# Patient Record
Sex: Female | Born: 1935 | Race: White | Hispanic: No
Health system: Southern US, Community
[De-identification: ages and names within clinical notes are randomized; demographics above are authoritative.]

## PROBLEM LIST (undated history)

## (undated) DIAGNOSIS — I639 Cerebral infarction, unspecified: Secondary | ICD-10-CM

## (undated) DIAGNOSIS — E7800 Pure hypercholesterolemia, unspecified: Secondary | ICD-10-CM

## (undated) DIAGNOSIS — I4891 Unspecified atrial fibrillation: Secondary | ICD-10-CM

## (undated) DIAGNOSIS — I2510 Atherosclerotic heart disease of native coronary artery without angina pectoris: Secondary | ICD-10-CM

## (undated) DIAGNOSIS — E079 Disorder of thyroid, unspecified: Secondary | ICD-10-CM

## (undated) DIAGNOSIS — K922 Gastrointestinal hemorrhage, unspecified: Secondary | ICD-10-CM

## (undated) DIAGNOSIS — I219 Acute myocardial infarction, unspecified: Secondary | ICD-10-CM

## (undated) DIAGNOSIS — I509 Heart failure, unspecified: Secondary | ICD-10-CM

## (undated) DIAGNOSIS — I10 Essential (primary) hypertension: Secondary | ICD-10-CM

## (undated) HISTORY — PX: ABDOMINAL HYSTERECTOMY: SHX81

## (undated) HISTORY — PX: CHOLECYSTECTOMY: SHX55

## (undated) HISTORY — PX: TONSILLECTOMY: SUR1361

---

## 2016-04-07 ENCOUNTER — Inpatient Hospital Stay (HOSPITAL_COMMUNITY)
Admission: EM | Admit: 2016-04-07 | Discharge: 2016-04-30 | DRG: 023 | Disposition: E | Payer: Medicare Other | Attending: Neurology | Admitting: Neurology

## 2016-04-07 ENCOUNTER — Encounter (HOSPITAL_COMMUNITY): Payer: Self-pay | Admitting: Emergency Medicine

## 2016-04-07 ENCOUNTER — Inpatient Hospital Stay (HOSPITAL_COMMUNITY): Payer: Medicare Other

## 2016-04-07 ENCOUNTER — Emergency Department (HOSPITAL_COMMUNITY): Payer: Medicare Other

## 2016-04-07 ENCOUNTER — Encounter (HOSPITAL_COMMUNITY): Admission: EM | Disposition: E | Payer: Self-pay | Source: Home / Self Care | Attending: Neurology

## 2016-04-07 ENCOUNTER — Inpatient Hospital Stay (HOSPITAL_COMMUNITY): Payer: Medicare Other | Admitting: Certified Registered Nurse Anesthetist

## 2016-04-07 DIAGNOSIS — R4701 Aphasia: Secondary | ICD-10-CM | POA: Diagnosis present

## 2016-04-07 DIAGNOSIS — I63032 Cerebral infarction due to thrombosis of left carotid artery: Secondary | ICD-10-CM | POA: Diagnosis present

## 2016-04-07 DIAGNOSIS — I97711 Intraoperative cardiac arrest during other surgery: Secondary | ICD-10-CM | POA: Diagnosis not present

## 2016-04-07 DIAGNOSIS — R001 Bradycardia, unspecified: Secondary | ICD-10-CM | POA: Diagnosis not present

## 2016-04-07 DIAGNOSIS — E876 Hypokalemia: Secondary | ICD-10-CM | POA: Diagnosis present

## 2016-04-07 DIAGNOSIS — G935 Compression of brain: Secondary | ICD-10-CM | POA: Diagnosis not present

## 2016-04-07 DIAGNOSIS — G9340 Encephalopathy, unspecified: Secondary | ICD-10-CM | POA: Diagnosis present

## 2016-04-07 DIAGNOSIS — D72829 Elevated white blood cell count, unspecified: Secondary | ICD-10-CM | POA: Diagnosis present

## 2016-04-07 DIAGNOSIS — Z9282 Status post administration of tPA (rtPA) in a different facility within the last 24 hours prior to admission to current facility: Secondary | ICD-10-CM | POA: Diagnosis not present

## 2016-04-07 DIAGNOSIS — I480 Paroxysmal atrial fibrillation: Secondary | ICD-10-CM | POA: Diagnosis present

## 2016-04-07 DIAGNOSIS — G936 Cerebral edema: Secondary | ICD-10-CM | POA: Diagnosis not present

## 2016-04-07 DIAGNOSIS — I509 Heart failure, unspecified: Secondary | ICD-10-CM | POA: Diagnosis present

## 2016-04-07 DIAGNOSIS — E039 Hypothyroidism, unspecified: Secondary | ICD-10-CM | POA: Diagnosis present

## 2016-04-07 DIAGNOSIS — I63512 Cerebral infarction due to unspecified occlusion or stenosis of left middle cerebral artery: Secondary | ICD-10-CM | POA: Diagnosis not present

## 2016-04-07 DIAGNOSIS — E119 Type 2 diabetes mellitus without complications: Secondary | ICD-10-CM | POA: Diagnosis present

## 2016-04-07 DIAGNOSIS — I110 Hypertensive heart disease with heart failure: Secondary | ICD-10-CM | POA: Diagnosis present

## 2016-04-07 DIAGNOSIS — I2510 Atherosclerotic heart disease of native coronary artery without angina pectoris: Secondary | ICD-10-CM | POA: Diagnosis present

## 2016-04-07 DIAGNOSIS — Z90710 Acquired absence of both cervix and uterus: Secondary | ICD-10-CM

## 2016-04-07 DIAGNOSIS — W19XXXA Unspecified fall, initial encounter: Secondary | ICD-10-CM | POA: Diagnosis present

## 2016-04-07 DIAGNOSIS — Z66 Do not resuscitate: Secondary | ICD-10-CM | POA: Diagnosis not present

## 2016-04-07 DIAGNOSIS — Z8673 Personal history of transient ischemic attack (TIA), and cerebral infarction without residual deficits: Secondary | ICD-10-CM | POA: Diagnosis not present

## 2016-04-07 DIAGNOSIS — E785 Hyperlipidemia, unspecified: Secondary | ICD-10-CM | POA: Diagnosis present

## 2016-04-07 DIAGNOSIS — I998 Other disorder of circulatory system: Secondary | ICD-10-CM

## 2016-04-07 DIAGNOSIS — I63312 Cerebral infarction due to thrombosis of left middle cerebral artery: Secondary | ICD-10-CM | POA: Diagnosis not present

## 2016-04-07 DIAGNOSIS — I639 Cerebral infarction, unspecified: Secondary | ICD-10-CM

## 2016-04-07 DIAGNOSIS — J9690 Respiratory failure, unspecified, unspecified whether with hypoxia or hypercapnia: Secondary | ICD-10-CM

## 2016-04-07 DIAGNOSIS — D649 Anemia, unspecified: Secondary | ICD-10-CM | POA: Diagnosis present

## 2016-04-07 DIAGNOSIS — J9601 Acute respiratory failure with hypoxia: Secondary | ICD-10-CM | POA: Diagnosis not present

## 2016-04-07 DIAGNOSIS — Z515 Encounter for palliative care: Secondary | ICD-10-CM | POA: Diagnosis not present

## 2016-04-07 DIAGNOSIS — R29724 NIHSS score 24: Secondary | ICD-10-CM | POA: Diagnosis present

## 2016-04-07 DIAGNOSIS — I6789 Other cerebrovascular disease: Secondary | ICD-10-CM | POA: Diagnosis not present

## 2016-04-07 DIAGNOSIS — G8191 Hemiplegia, unspecified affecting right dominant side: Secondary | ICD-10-CM | POA: Diagnosis present

## 2016-04-07 DIAGNOSIS — E038 Other specified hypothyroidism: Secondary | ICD-10-CM

## 2016-04-07 DIAGNOSIS — I252 Old myocardial infarction: Secondary | ICD-10-CM

## 2016-04-07 DIAGNOSIS — Z4659 Encounter for fitting and adjustment of other gastrointestinal appliance and device: Secondary | ICD-10-CM

## 2016-04-07 DIAGNOSIS — I63413 Cerebral infarction due to embolism of bilateral middle cerebral arteries: Secondary | ICD-10-CM | POA: Diagnosis present

## 2016-04-07 DIAGNOSIS — R32 Unspecified urinary incontinence: Secondary | ICD-10-CM | POA: Diagnosis present

## 2016-04-07 DIAGNOSIS — J9600 Acute respiratory failure, unspecified whether with hypoxia or hypercapnia: Secondary | ICD-10-CM | POA: Diagnosis not present

## 2016-04-07 DIAGNOSIS — Q251 Coarctation of aorta: Secondary | ICD-10-CM

## 2016-04-07 HISTORY — DX: Unspecified atrial fibrillation: I48.91

## 2016-04-07 HISTORY — DX: Pure hypercholesterolemia, unspecified: E78.00

## 2016-04-07 HISTORY — DX: Heart failure, unspecified: I50.9

## 2016-04-07 HISTORY — DX: Disorder of thyroid, unspecified: E07.9

## 2016-04-07 HISTORY — PX: IR GENERIC HISTORICAL: IMG1180011

## 2016-04-07 HISTORY — DX: Cerebral infarction, unspecified: I63.9

## 2016-04-07 HISTORY — DX: Essential (primary) hypertension: I10

## 2016-04-07 HISTORY — PX: RADIOLOGY WITH ANESTHESIA: SHX6223

## 2016-04-07 HISTORY — DX: Atherosclerotic heart disease of native coronary artery without angina pectoris: I25.10

## 2016-04-07 HISTORY — DX: Acute myocardial infarction, unspecified: I21.9

## 2016-04-07 HISTORY — DX: Gastrointestinal hemorrhage, unspecified: K92.2

## 2016-04-07 LAB — POCT I-STAT 3, ART BLOOD GAS (G3+)
ACID-BASE DEFICIT: 3 mmol/L — AB (ref 0.0–2.0)
Bicarbonate: 21.6 mEq/L (ref 20.0–24.0)
O2 Saturation: 98 %
PCO2 ART: 35.1 mmHg (ref 35.0–45.0)
PH ART: 7.398 (ref 7.350–7.450)
PO2 ART: 107 mmHg — AB (ref 80.0–100.0)
Patient temperature: 98.6
TCO2: 23 mmol/L (ref 0–100)

## 2016-04-07 SURGERY — RADIOLOGY WITH ANESTHESIA
Anesthesia: General

## 2016-04-07 MED ORDER — MIDAZOLAM HCL 5 MG/5ML IJ SOLN
INTRAMUSCULAR | Status: DC | PRN
  Administered 2016-04-07: 2 mg via INTRAVENOUS

## 2016-04-07 MED ORDER — ROCURONIUM BROMIDE 100 MG/10ML IV SOLN
INTRAVENOUS | Status: DC | PRN
  Administered 2016-04-07: 20 mg via INTRAVENOUS
  Administered 2016-04-07 (×2): 30 mg via INTRAVENOUS
  Administered 2016-04-07: 20 mg via INTRAVENOUS

## 2016-04-07 MED ORDER — EPHEDRINE SULFATE 50 MG/ML IJ SOLN
INTRAMUSCULAR | Status: DC | PRN
  Administered 2016-04-07: 5 mg via INTRAVENOUS
  Administered 2016-04-07: 7.5 mg via INTRAVENOUS

## 2016-04-07 MED ORDER — NITROGLYCERIN 1 MG/10 ML FOR IR/CATH LAB
INTRA_ARTERIAL | Status: AC
  Filled 2016-04-07: qty 10

## 2016-04-07 MED ORDER — SODIUM CHLORIDE 0.9 % IV SOLN
INTRAVENOUS | Status: DC
  Administered 2016-04-07: 1000 mL via INTRAVENOUS

## 2016-04-07 MED ORDER — ACETAMINOPHEN 650 MG RE SUPP: 650.0000 mg | Freq: Four times a day (QID) | RECTAL | Status: DC | PRN

## 2016-04-07 MED ORDER — ACETAMINOPHEN 650 MG RE SUPP: 650.0000 mg | RECTAL | Status: DC | PRN

## 2016-04-07 MED ORDER — IOPAMIDOL (ISOVUE-370) INJECTION 76%: 50.0000 mL | Freq: Once | INTRAVENOUS | Status: DC | PRN

## 2016-04-07 MED ORDER — ACETAMINOPHEN 325 MG PO TABS: 650.0000 mg | ORAL_TABLET | ORAL | Status: DC | PRN

## 2016-04-07 MED ORDER — LACTATED RINGERS IV SOLN
INTRAVENOUS | Status: DC | PRN
  Administered 2016-04-07: 20:00:00 via INTRAVENOUS

## 2016-04-07 MED ORDER — IOPAMIDOL (ISOVUE-300) INJECTION 61%
INTRAVENOUS | Status: AC
  Administered 2016-04-07: 80 mL
  Filled 2016-04-07: qty 300

## 2016-04-07 MED ORDER — CEFAZOLIN SODIUM-DEXTROSE 2-4 GM/100ML-% IV SOLN
INTRAVENOUS | Status: AC
  Filled 2016-04-07: qty 100

## 2016-04-07 MED ORDER — SODIUM CHLORIDE 0.9 % IV SOLN
INTRAVENOUS | Status: DC
  Administered 2016-04-08 – 2016-04-09 (×2): via INTRAVENOUS

## 2016-04-07 MED ORDER — EPTIFIBATIDE 20 MG/10ML IV SOLN
INTRAVENOUS | Status: AC
  Filled 2016-04-07: qty 10

## 2016-04-07 MED ORDER — SENNOSIDES-DOCUSATE SODIUM 8.6-50 MG PO TABS
1.0000 | ORAL_TABLET | Freq: Every evening | ORAL | Status: DC | PRN
  Filled 2016-04-07: qty 1

## 2016-04-07 MED ORDER — ACETAMINOPHEN 500 MG PO TABS
1000.0000 mg | ORAL_TABLET | Freq: Four times a day (QID) | ORAL | Status: DC | PRN
  Administered 2016-04-09: 1000 mg via ORAL
  Filled 2016-04-07: qty 2

## 2016-04-07 MED ORDER — ESMOLOL HCL 100 MG/10ML IV SOLN
INTRAVENOUS | Status: DC | PRN
  Administered 2016-04-07: 30 mg via INTRAVENOUS

## 2016-04-07 MED ORDER — SUCCINYLCHOLINE CHLORIDE 20 MG/ML IJ SOLN
INTRAMUSCULAR | Status: DC | PRN
  Administered 2016-04-07: 120 mg via INTRAVENOUS

## 2016-04-07 MED ORDER — CEFAZOLIN SODIUM-DEXTROSE 2-3 GM-% IV SOLR
INTRAVENOUS | Status: DC | PRN
  Administered 2016-04-07: 2 g via INTRAVENOUS

## 2016-04-07 MED ORDER — FENTANYL CITRATE (PF) 100 MCG/2ML IJ SOLN
INTRAMUSCULAR | Status: DC | PRN
  Administered 2016-04-07 (×2): 100 ug via INTRAVENOUS
  Administered 2016-04-07: 50 ug via INTRAVENOUS
  Administered 2016-04-07: 100 ug via INTRAVENOUS
  Administered 2016-04-07: 50 ug via INTRAVENOUS
  Administered 2016-04-07: 100 ug via INTRAVENOUS

## 2016-04-07 MED ORDER — ONDANSETRON HCL 4 MG/2ML IJ SOLN: 4.0000 mg | Freq: Four times a day (QID) | INTRAMUSCULAR | Status: DC | PRN

## 2016-04-07 MED ORDER — NICARDIPINE HCL IN NACL 20-0.86 MG/200ML-% IV SOLN
INTRAVENOUS | Status: AC
  Filled 2016-04-07: qty 200

## 2016-04-07 MED ORDER — PHENYLEPHRINE HCL 10 MG/ML IJ SOLN
INTRAVENOUS | Status: DC | PRN
  Administered 2016-04-07: 5 ug/min via INTRAVENOUS

## 2016-04-07 MED ORDER — STROKE: EARLY STAGES OF RECOVERY BOOK
Freq: Once | Status: DC
  Filled 2016-04-07 (×2): qty 1

## 2016-04-07 MED ORDER — NICARDIPINE HCL IN NACL 20-0.86 MG/200ML-% IV SOLN
5.0000 mg/h | INTRAVENOUS | Status: DC
  Administered 2016-04-07: 2.5 mg/h via INTRAVENOUS
  Administered 2016-04-07 (×2): 5 mg/h via INTRAVENOUS

## 2016-04-07 MED ORDER — IOPAMIDOL (ISOVUE-370) INJECTION 76%
50.0000 mL | Freq: Once | INTRAVENOUS | Status: AC | PRN
  Administered 2016-04-07: 50 mL via INTRAVENOUS

## 2016-04-07 MED ORDER — FAMOTIDINE IN NACL 20-0.9 MG/50ML-% IV SOLN
20.0000 mg | Freq: Two times a day (BID) | INTRAVENOUS | Status: DC
Start: 2016-04-07 — End: 2016-04-10
  Administered 2016-04-07 – 2016-04-10 (×6): 20 mg via INTRAVENOUS
  Filled 2016-04-07 (×6): qty 50

## 2016-04-07 MED ORDER — PROPOFOL 10 MG/ML IV BOLUS
INTRAVENOUS | Status: DC | PRN
  Administered 2016-04-07: 80 mg via INTRAVENOUS

## 2016-04-07 NOTE — ED Triage Notes (Signed)
Pt to ER BIB [facility] as transfer code stroke from [facility]. Per EMS pt was with family at [employer] [place] when suddenly at 3 pm fell to the floor unresponsive without any purposeful movement. Later found to have right sided weakness and right sided gaze. Positive for vomiting at [facility], received 4 mg zofran. Pt received TPA at [facility] with a bolus of 5.5 and the rest at 48.4 ml/hr at 15:55. EMS vitals 138/94, 96% 2L and HR 60. Pt has skin tears to right hand present.  At present patient is moving all extremities including right side, however right arm remaining significantly weaker than right leg. Opening eyes to name being called. HR at this time 125 bpm. O2 88% on RA.

## 2016-04-07 NOTE — Consult Note (Signed)
PULMONARY / CRITICAL CARE MEDICINE   Name: Destiny Davis MRN: [id number] DOB: 11-04-1918    ADMISSION DATE:  18-Apr-2016 CONSULTATION DATE:  2016-04-18  REFERRING MD:  Dr. Hilda Blades (Neurology)  CHIEF COMPLAINT:  CVA  HISTORY OF PRESENT ILLNESS:   82 year old female with PMH as below, which is significant for PAF, CHF, CVA, hypothyroid who fell 8/9 around 1430 and was then noticed to have R sided facial droop and R sided hemiparesis. Upon arrival to the ED at [facility] she was evaluated and administered IV TPA. She was then transferred to [facility] for further evaluation. She underwent CT angiogram, which revealed occlusion of L MCA. She was determined to be a very independent 82 year old and was deemed a candidate for interventional radiology for possibly intervention. She was taken to IR and had endovascular treatment of L MCA occlusion with minimal revascularization as well as mechanical and pharmacologic attempts at thrombolysis. She was brought to ICU intubated for recovery and further evaluation.   PAST MEDICAL HISTORY :  She  has a past medical history of Atrial fibrillation (HCC); CHF (congestive heart failure) (HCC); Coronary artery disease; GI bleed; High cholesterol; Hypertension; MI (myocardial infarction) (HCC); Stroke [facility]); and Thyroid disease.  PAST SURGICAL HISTORY: She  has a past surgical history that includes Cholecystectomy; Abdominal hysterectomy; and Tonsillectomy.  No Known Allergies  No current facility-administered medications on file prior to encounter.    No current outpatient prescriptions on file prior to encounter.    FAMILY HISTORY:  Her has no family status information on file.    SOCIAL HISTORY: She    REVIEW OF SYSTEMS:   Unable to obtain as the patient is intubated.  SUBJECTIVE: On vent, unresponsive.   VITAL SIGNS: BP (!) 133/102   Pulse 110   Resp 14   SpO2 99%   HEMODYNAMICS:    VENTILATOR SETTINGS:    INTAKE / OUTPUT: No  intake/output data recorded.  PHYSICAL EXAMINATION: General: Elderly female, resting in bed, in NAD. Neuro: Non-responsive. HEENT: [place]/AT. PERRL, sclerae anicteric. Cardiovascular: RRR, no M/R/G.  Lungs: Respirations even and unlabored.  CTA bilaterally, No W/R/R. Abdomen: BS x 4, soft, NT/ND.  Musculoskeletal: No gross deformities, no edema.  Skin: Intact, warm, no rashes.   LABS:  BMET No results for input(s): NA, K, CL, CO2, BUN, CREATININE, GLUCOSE in the last 168 hours.  Electrolytes No results for input(s): CALCIUM, MG, PHOS in the last 168 hours.  CBC No results for input(s): WBC, HGB, HCT, PLT in the last 168 hours.  Coag's No results for input(s): APTT, INR in the last 168 hours.  Sepsis Markers No results for input(s): LATICACIDVEN, PROCALCITON, O2SATVEN in the last 168 hours.  ABG No results for input(s): PHART, PCO2ART, PO2ART in the last 168 hours.  Liver Enzymes No results for input(s): AST, ALT, ALKPHOS, BILITOT, ALBUMIN in the last 168 hours.  Cardiac Enzymes No results for input(s): TROPONINI, PROBNP in the last 168 hours.  Glucose No results for input(s): GLUCAP in the last 168 hours.  Imaging Ct Angio Head W Or Wo Contrast  Result Date: 04-18-16 CLINICAL DATA:  82 year old female with altered mental status. Subsequent encounter. EXAM: CT ANGIOGRAPHY HEAD AND NECK TECHNIQUE: Multidetector CT imaging of the head and neck was performed using the standard protocol during bolus administration of intravenous contrast. Multiplanar CT image reconstructions and MIPs were obtained to evaluate the vascular anatomy. Carotid stenosis measurements (when applicable) are obtained utilizing NASCET criteria, using the distal internal carotid  diameter as the denominator. CONTRAST:  50 cc Isovue 370. COMPARISON:  None. Per history of Dr. Hilda Blades, patient has [facility] CT scan read as a code stroke. These images are currently unavailable. This is being worked on  by Haematologist. FINDINGS: CT HEAD Brain: Left middle cerebral artery distribution early infarct with poor collaterals as discussed below. This may end up being a large distribution infarct. Global atrophy. No intracranial hemorrhage. No intracranial mass. Calvarium and skull base: No fracture. Paranasal sinuses: Mucosal thickening maxillary sinuses greater on left measuring up to 1 cm. Orbits: Conjugate gaze to the left. CTA NECK Aortic arch: Common origin innominate and left common carotid artery. Plaque aortic arch. Plaque with mild narrowing proximal left subclavian artery. 2 cm above its origin, kink with mild to slightly moderate narrowing left subclavian artery. Right carotid system: Moderate narrowing origin of the right common carotid artery. Plaque right carotid bifurcation with 50% maximal diameter stenosis. Left carotid system: Plaque left carotid bifurcation with less than 50% diameter stenosis. Vertebral arteries:Plaque with mild narrowing origin left vertebral artery. Ectatic proximal left vertebral artery. Left vertebral artery is the dominant vertebral artery. Right vertebral artery is congenitally small. Skeleton: No obvious fracture. Rotation C1 on C2 may be related to head positioning. Other neck: Heterogeneous large thyroid gland with substernal extension on left. No worrisome dominant mass. Upper chest: No worrisome lung apical lesion. CTA HEAD Anterior circulation: Plaque cavernous segment internal carotid artery bilaterally mild narrowing bilaterally. Fetal origin right posterior cerebral artery. Abrupt cut off of flow distal M1 segment left middle cerebral artery (thrombus) with poor collateral flow. Posterior circulation: Left vertebral artery is dominant with mild narrowing. Right vertebral artery diminutive in size, irregular and narrowed throughout its course. Ectatic basilar artery with mild narrowing. Venous sinuses: Patent. Anatomic variants: As above. Delayed phase: Delayed filling in of  left middle cerebral artery branches. IMPRESSION: CT HEAD Left middle cerebral artery distribution early infarct with poor collaterals as discussed below. This may end up being a large infarct. CTA NECK Common origin innominate and left common carotid artery. Plaque aortic arch. Plaque with mild narrowing proximal left subclavian artery. 2 cm above its origin, kink with mild to slightly moderate narrowing left subclavian artery. Moderate narrowing origin of the right common carotid artery. Plaque right carotid bifurcation with 50% maximal diameter stenosis. Plaque left carotid bifurcation with less than 50% diameter stenosis. Plaque with mild narrowing origin left vertebral artery. Ectatic proximal left vertebral artery. Left vertebral artery is the dominant vertebral artery. Right vertebral artery is congenitally small. No obvious fracture. Rotation C1 on C2 may be related to head positioning. Heterogeneous large thyroid gland with substernal extension on left. No worrisome dominant mass. CTA HEAD Abrupt cut off of flow distal M1 segment left middle cerebral artery (thrombus) with poor collateral flow. Delayed filling in of left middle cerebral artery branches. Plaque cavernous segment internal carotid artery bilaterally mild narrowing bilaterally. Fetal origin right posterior cerebral artery. Left vertebral artery is dominant with mild narrowing. Right vertebral artery diminutive in size, irregular and narrowed throughout its course. Ectatic basilar artery with mild narrowing. These results were called by telephone at the time of interpretation on 04/09/2016 at 5:47 pm to Dr. Rose Fillers , who verbally acknowledged these results. Electronically Signed   By: Lacy Duverney M.D.   On: 04/20/2016 18:19   Ct Angio Neck W Or Wo Contrast  Result Date: 04/12/2016 CLINICAL DATA:  82 year old female with altered mental status. Subsequent encounter. EXAM: CT ANGIOGRAPHY  HEAD AND NECK TECHNIQUE: Multidetector CT imaging  of the head and neck was performed using the standard protocol during bolus administration of intravenous contrast. Multiplanar CT image reconstructions and MIPs were obtained to evaluate the vascular anatomy. Carotid stenosis measurements (when applicable) are obtained utilizing NASCET criteria, using the distal internal carotid diameter as the denominator. CONTRAST:  50 cc Isovue 370. COMPARISON:  None. Per history of Dr. Hilda Blades, patient has [facility] CT scan read as a code stroke. These images are currently unavailable. This is being worked on by Haematologist. FINDINGS: CT HEAD Brain: Left middle cerebral artery distribution early infarct with poor collaterals as discussed below. This may end up being a large distribution infarct. Global atrophy. No intracranial hemorrhage. No intracranial mass. Calvarium and skull base: No fracture. Paranasal sinuses: Mucosal thickening maxillary sinuses greater on left measuring up to 1 cm. Orbits: Conjugate gaze to the left. CTA NECK Aortic arch: Common origin innominate and left common carotid artery. Plaque aortic arch. Plaque with mild narrowing proximal left subclavian artery. 2 cm above its origin, kink with mild to slightly moderate narrowing left subclavian artery. Right carotid system: Moderate narrowing origin of the right common carotid artery. Plaque right carotid bifurcation with 50% maximal diameter stenosis. Left carotid system: Plaque left carotid bifurcation with less than 50% diameter stenosis. Vertebral arteries:Plaque with mild narrowing origin left vertebral artery. Ectatic proximal left vertebral artery. Left vertebral artery is the dominant vertebral artery. Right vertebral artery is congenitally small. Skeleton: No obvious fracture. Rotation C1 on C2 may be related to head positioning. Other neck: Heterogeneous large thyroid gland with substernal extension on left. No worrisome dominant mass. Upper chest: No worrisome lung apical lesion. CTA HEAD  Anterior circulation: Plaque cavernous segment internal carotid artery bilaterally mild narrowing bilaterally. Fetal origin right posterior cerebral artery. Abrupt cut off of flow distal M1 segment left middle cerebral artery (thrombus) with poor collateral flow. Posterior circulation: Left vertebral artery is dominant with mild narrowing. Right vertebral artery diminutive in size, irregular and narrowed throughout its course. Ectatic basilar artery with mild narrowing. Venous sinuses: Patent. Anatomic variants: As above. Delayed phase: Delayed filling in of left middle cerebral artery branches. IMPRESSION: CT HEAD Left middle cerebral artery distribution early infarct with poor collaterals as discussed below. This may end up being a large infarct. CTA NECK Common origin innominate and left common carotid artery. Plaque aortic arch. Plaque with mild narrowing proximal left subclavian artery. 2 cm above its origin, kink with mild to slightly moderate narrowing left subclavian artery. Moderate narrowing origin of the right common carotid artery. Plaque right carotid bifurcation with 50% maximal diameter stenosis. Plaque left carotid bifurcation with less than 50% diameter stenosis. Plaque with mild narrowing origin left vertebral artery. Ectatic proximal left vertebral artery. Left vertebral artery is the dominant vertebral artery. Right vertebral artery is congenitally small. No obvious fracture. Rotation C1 on C2 may be related to head positioning. Heterogeneous large thyroid gland with substernal extension on left. No worrisome dominant mass. CTA HEAD Abrupt cut off of flow distal M1 segment left middle cerebral artery (thrombus) with poor collateral flow. Delayed filling in of left middle cerebral artery branches. Plaque cavernous segment internal carotid artery bilaterally mild narrowing bilaterally. Fetal origin right posterior cerebral artery. Left vertebral artery is dominant with mild narrowing. Right  vertebral artery diminutive in size, irregular and narrowed throughout its course. Ectatic basilar artery with mild narrowing. These results were called by telephone at the time of interpretation on 19-Apr-2016 at  5:47 pm to Dr. Rose Fillers , who verbally acknowledged these results. Electronically Signed   By: Lacy Duverney M.D.   On: 2016-04-20 18:19   Dg Chest Portable 1 View  Result Date: 04-20-2016 CLINICAL DATA:  Stroke.  Possible aspiration. EXAM: PORTABLE CHEST 1 VIEW COMPARISON:  Chest CT dated 07/23/2015 and chest x-ray dated 07/09/2015 FINDINGS: Chronic mild cardiomegaly. Pulmonary vascularity is normal. No infiltrates or effusions. Accentuation of the interstitial markings at the bases is felt to be due to a relatively shallow inspiration. No acute bone abnormality. Calcification in the thoracic aorta. IMPRESSION: No acute abnormality.  Chronic mild cardiomegaly. Aortic atherosclerosis. Electronically Signed   By: Francene Boyers M.D.   On: 20-Apr-2016 18:25     STUDIES:  CT head 8/9 > L MCA infarct  CULTURES: None.  ANTIBIOTICS: None.  SIGNIFICANT EVENTS: 8/9 - CVA, IV TPA, to IR. To ICU for recovery  LINES/TUBES: ETT 8/9 >  DISCUSSION: Very independent 82 year old suffered R MCA stroke 8/9. Received IV TPA, then to IR. Out to ICU on vent.    ASSESSMENT / PLAN:  NEUROLOGIC A:   Acute encephalopathy. L MCA infarct - s/p tPA and partial revascularization / mechanical thrombolysis attempts per IR 8/9. P:   Sedation: Fentanyl PRN / Midazolam PRN. RASS goal: 0 to -1. Daily WUA. Stroke workup per neuro. Hold outpatient sertraline.   PULMONARY A: Inability to protect airway in the post-operative setting.  P:   Full vent support. SBT/WUA in AM with hopeful extubation. CXR in AM.  CARDIOVASCULAR A:  H/o A.fib, HTN, MI, CAD. P:  Continue cardene for SBP goal 120-140 per Dr. Corliss Skains. Continue outpatient atorvastatin. Hold outpatient furosemide, nitro,  lisinopril.  RENAL A:   No acute issues. P:   BMP tonight and in AM.  GASTROINTESTINAL A:   GI prophylaxis. Nutrition. P:   SUP: famotidine. NPO.  HEMATOLOGIC A:   TPA administered 1530. VTE prophylaxis. P: SCD's. CBC now and in AM.  INFECTIOUS A:   No indication of infection. P:   Monitor clinically.  ENDOCRINE A:   Hypothyroidism. P:   Continue outpatient synthroid. Assess TSH.   FAMILY  Updates: Son and daughter updated at bedside.  Inter-disciplinary family meet or Palliative Care meeting due by:  8/16.   Rutherford Guys, PA - [facility] Pager: [phone]  or [phone] 04/08/2016, 12:10 AM   Attending:  I have seen and examined the patient with nurse practitioner/resident and agree with the note above.  We formulated the plan together and I elicited the following history.    Ms. cavernous had stroke symptoms that started around 2:30 on 2016/04/20. She went to [facility] or she was administered IV TPA, but continue to have symptoms so she was transferred to our facility for further management. Decision was made after a CT angiogram confirmed persistent occlusion of the left MCA for her to go to interventional radiology to attempt thromboliysis. Revascularization was attempted but it appears from the report that she still has a left MCA occlusion. She was intubated for that procedure and pulmonary critical care medicine was consulted for management of the ventilator.  On exam: Sedated on vent Open's eyes to voice, moves left side spontaneously but not to command, no movement right arm or leg Lungs clear to auscultation with ventilatory supported breaths Irregularly irregular no murmurs gallops rubs Belly soft nontender  CT head results reviewed showing dense left MCA sign  with findings consistent with left MCA stroke  Chest x-ray images personally reviewed showing clear lungs with a new tracheal tube  in place  Acute left MCA stroke, status post attempted revascularization as well as systemic TPA. Prognosis overall seems guarded, post stroke care, blood pressure parameters are per neurology. Refer to them for management of order sets.  Acute respiratory failure with hypoxemia, mostly due to need for sedation during the procedure. We will attempt extubation when okay by the neurology service. She will likely need to have speech therapy evaluation afterwards.  Rest as above  Critical care time 32 minutes  Heber CarolinaBrent Nekeshia Lenhardt, MD [place] [facility] Pager: [phone] Cell: [phone] After 3pm or if no response, call [phone]

## 2016-04-07 NOTE — ED Notes (Signed)
Decision made by family and neurologist to proceed with IR intervention. Called IR to determine time patient will transfer to department. IR to notify this RN.

## 2016-04-07 NOTE — Transfer of Care (Signed)
Immediate Anesthesia Transfer of Care Note  Patient: Destiny Davis  Procedure(s) Performed: Procedure(s): RADIOLOGY WITH ANESTHESIA (N/A)  Patient Location: SICU  Anesthesia Type:General  Level of Consciousness: Patient remains intubated per anesthesia plan  Airway & Oxygen Therapy: Patient remains intubated per anesthesia plan and Patient placed on Ventilator (see vital sign flow sheet for setting)  Post-op Assessment: Report given to RN and Post -op Vital signs reviewed and stable  Post vital signs: Reviewed and stable  Last Vitals:  Vitals:   Sep 24, 2015 1830 Sep 24, 2015 1845  BP: 130/89 (!) 133/102  Pulse: (!) 122 110  Resp: 18 14    Last Pain: There were no vitals filed for this visit.       Complications: No apparent anesthesia complications

## 2016-04-07 NOTE — ED Notes (Signed)
Patient's O2 sat 89% on room air, patient placed on 2L [place], O2 sat up to 90. Patient placed on 4L [place], O2 sat up to 99%. Dr Hilda Bladesarmstrong made aware

## 2016-04-07 NOTE — Anesthesia Procedure Notes (Signed)
Procedure Name: Intubation Date/Time: 04/18/2016 7:56 PM Performed by: Madox Corkins S Pre-anesthesia Checklist: Patient identified, Emergency Drugs available, Suction available, Patient being monitored and Timeout performed Patient Re-evaluated:Patient Re-evaluated prior to inductionOxygen Delivery Method: Circle system utilized Preoxygenation: Pre-oxygenation with 100% oxygen Intubation Type: IV induction, Rapid sequence and Cricoid Pressure applied Laryngoscope Size: Mac and 3 Grade View: Grade I Tube type: Subglottic suction tube Tube size: 7.5 mm Number of attempts: 1 Airway Equipment and Method: Stylet Placement Confirmation: ETT inserted through vocal cords under direct vision,  positive ETCO2 and breath sounds checked- equal and bilateral Secured at: 22 cm Tube secured with: Tape Dental Injury: Teeth and Oropharynx as per pre-operative assessment

## 2016-04-07 NOTE — Anesthesia Preprocedure Evaluation (Addendum)
Anesthesia Evaluation  Patient identified by MRN, date of birth, ID bandPreop documentation limited or incomplete due to emergent nature of procedure.  Airway Mallampati: II  TM Distance: >3 FB Neck ROM: Full    Dental no notable dental hx.    Pulmonary    Pulmonary exam normal breath sounds clear to auscultation       Cardiovascular hypertension, + CAD, + Past MI and +CHF  Normal cardiovascular exam Rhythm:Regular Rate:Normal     Neuro/Psych CVA    GI/Hepatic   Endo/Other    Renal/GU      Musculoskeletal   Abdominal   Peds  Hematology   Anesthesia Other Findings   Reproductive/Obstetrics                            Anesthesia Physical Anesthesia Plan  ASA: IV and emergent  Anesthesia Plan: General   Post-op Pain Management:    Induction: Intravenous, Rapid sequence and Cricoid pressure planned  Airway Management Planned: Oral ETT  Additional Equipment:   Intra-op Plan:   Post-operative Plan: Post-operative intubation/ventilation  Informed Consent: I have reviewed the patients History and Physical, chart, labs and discussed the procedure including the risks, benefits and alternatives for the proposed anesthesia with the patient or authorized representative who has indicated his/her understanding and acceptance.   Dental advisory given  Plan Discussed with: CRNA  Anesthesia Plan Comments:        Anesthesia Quick Evaluation

## 2016-04-07 NOTE — ED Notes (Signed)
DR Hilda BladesArmstrong and Interventional radiologist speaking with patient's son and daughter in regards to plan of care

## 2016-04-07 NOTE — Procedures (Signed)
S/P endovascular treatment of LT MCA occlusion ,with minimal revascularization. x1pass with solitaire 4 mm x 40 mm retrieval device and multiple attempts  With  mechanical thrombolysis.

## 2016-04-07 NOTE — ED Notes (Signed)
Per interventionalist, patient is to remain in ER until team is ready for patient for procedure.

## 2016-04-07 NOTE — ED Notes (Signed)
DR. Hilda BladesArmstrong, neurologist at bedside

## 2016-04-07 NOTE — ED Triage Notes (Signed)
Pt arrived by [facility]. Was at baseline and shopping at [employer] with family, pt fell forward onto ground. Once responding to bystanders, pt was found to be altered, left gaze. [facility] to [facility] and code stroke protocol was started. Pt had ct scan and TPA given. Taken for CTA on arrival.

## 2016-04-07 NOTE — H&P (Signed)
Initial Neurological Consultation                      NEURO HOSPITALIST ADMISSION NOTE    Admit from ER   Chief complaint:  A aphasia and right hemiparesis.   HPI:                                                                                                                                          Destiny Davis is an 82 y.o. female who presents as a transfer from [facility]. It is reported that at 2:30 this afternoon she fell and was experiencing a facial and right hemiparesis. She arrived in the emergency room within the time window for TPA. The telemetry neurologist recommended administration of TPA. After initiating tPA therapy the ER physician requested transfer of the patient to [facility] emergency room for further evaluation and treatment.  Upon arrival the patient was taken to CTA for further evaluation. 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 This was discussed with the family. Destiny Davis's baseline status was discussed in length. The family reports that Destiny Davis lived independently and was able to take care of herself in all aspects. He report she was able to prepare her own meals bathing herself clothing herself and otherwise was functionally independent. We discussed the fact that she had been previously seen by neurologist and was noted to have some issues with her cognitive function. It was also noted that at some point within the past year she was started on Namenda. The family reports that they felt her symptoms were mild and that the Namenda provided no benefit. It was discontinued by the family. The family reports that Destiny Davis does not drive or maintain her own finances. However they report she was not actually having any difficulty driving there was simply concerned about her vision and her age. They assisted with her finances as they felt they were somewhat complex. However they report that she would still write her own checks.  The possibility of  interventional radiology was discussed with the family at length. All 3 children felt that they would like for [place] to proceed with interventional therapy. The case was discussed with Dr. Shan Levans. Dr. Shan Levans came to the hospital and discussed the risk and benefits of interventional neuroradiology with the family. After thorough discussion of the risk and benefits of the procedure the family requested additional intervention. The patient was subsequently taken to the interventional radiology suite.     Past Medical History:  Diagnosis Date  . Atrial fibrillation (HCC)   . CHF (congestive heart failure) (HCC)   . Coronary artery disease   . GI bleed   . High cholesterol   . Hypertension   . MI (myocardial infarction) (HCC)   . Stroke (HCC)    04/06/2016  . Thyroid disease    hypothyroid    Past  Surgical History:  Procedure Laterality Date  . ABDOMINAL HYSTERECTOMY    . CHOLECYSTECTOMY    . TONSILLECTOMY     adnoids removed also    MEDICATIONS:                                                                                                                     I have reviewed the patient's current medications.  No Known Allergies   Social History:  has no tobacco, alcohol, and drug history on file.  No family history on file.   ROS:                                                                                                                                       History obtained from chart review  General ROS: negative for - chills, fatigue, fever, night sweats, weight gain or weight loss Psychological ROS: negative for - behavioral disorder, hallucinations, memory difficulties, mood swings or suicidal ideation Ophthalmic ROS: negative for - blurry vision, double vision, eye pain or loss of vision ENT ROS: negative for - epistaxis, nasal discharge, oral lesions, sore throat, tinnitus or vertigo Allergy and Immunology ROS: negative for - hives or itchy/watery  eyes Hematological and Lymphatic ROS: negative for - bleeding problems, bruising or swollen lymph nodes Endocrine ROS: negative for - galactorrhea, hair pattern changes, polydipsia/polyuria or temperature intolerance Respiratory ROS: negative for - cough, hemoptysis, shortness of breath or wheezing Cardiovascular ROS: negative for - chest pain, dyspnea on exertion, edema or irregular heartbeat Gastrointestinal ROS: negative for - abdominal pain, diarrhea, hematemesis, nausea/vomiting or stool incontinence Genito-Urinary ROS: negative for - dysuria, hematuria, incontinence or urinary frequency/urgency Musculoskeletal ROS: negative for - joint swelling or muscular weakness Neurological ROS: as noted in HPI Dermatological ROS: negative for rash and skin lesion changes   General Exam  Blood pressure 130/89, pulse (!) 122, resp. rate 18, SpO2 97 %. HEENT-  Normocephalic, no lesions, without obvious abnormality.  Normal external eye and conjunctiva.  Normal TM's bilaterally.  Normal auditory canals and external ears. Normal external nose, mucus membranes and septum.  Normal pharynx. Cardiovascular- regular rate and rhythm, S1, S2 normal, no murmur, click, rub or gallop, pulses palpable throughout   Lungs- chest clear, no wheezing, rales, normal symmetric air entry, Heart exam - S1, S2 normal, no murmur, no gallop, rate regular Abdomen- soft, non-tender; bowel sounds normal; no masses,  no organomegaly Extremities- less then 2 second capillary refill Lymph-no adenopathy palpable Musculoskeletal-no joint tenderness, deformity or swelling Skin-warm and dry, no hyperpigmentation, vitiligo, or suspicious lesions  Neurological Examination Mental Status: Destiny Davis is a phasic and is unable to follow commands she opens her eyes and looks about and appears alert. Cranial Nerves: Pupils are equal and  reactive. There is depression of the right nasolabial fold. Motor: A few spontaneous movements of the right upper extremity are noted. However, the patient appears paretic in the right upper extremity. She is unable to cooperate with more formal neurological testing due to the aphasia. Sensory: There is withdrawal to stimulation. Deep Tendon Reflexes: Trace reflexes are noted in the right upper extremity. Plantars: Right: downgoing   Left: downgoing Cerebellar: Could not be tested.    Lab Results: Basic Metabolic Panel: No results for input(s): NA, K, CL, CO2, GLUCOSE, BUN, CREATININE, CALCIUM, MG, PHOS in the last 168 hours.  Liver Function Tests: No results for input(s): AST, ALT, ALKPHOS, BILITOT, PROT, ALBUMIN in the last 168 hours. No results for input(s): LIPASE, AMYLASE in the last 168 hours. No results for input(s): AMMONIA in the last 168 hours.  CBC: No results for input(s): WBC, NEUTROABS, HGB, HCT, MCV, PLT in the last 168 hours.  Cardiac Enzymes: No results for input(s): CKTOTAL, CKMB, CKMBINDEX, TROPONINI in the last 168 hours.  Lipid Panel: No results for input(s): CHOL, TRIG, HDL, CHOLHDL, VLDL, LDLCALC in the last 168 hours.  CBG: No results for input(s): GLUCAP in the last 168 hours.  Microbiology: No results found for this or any previous visit.  Coagulation Studies: No results for input(s): LABPROT, INR in the last 72 hours.  Imaging: Ct Angio Head W Or Wo Contrast  Result Date: 04-17-2016 CLINICAL DATA:  82 year old female with altered mental status. Subsequent encounter. EXAM: CT ANGIOGRAPHY HEAD AND NECK TECHNIQUE: Multidetector CT imaging of the head and neck was performed using the standard protocol during bolus administration of intravenous contrast. Multiplanar CT image reconstructions and MIPs were obtained to evaluate the vascular anatomy. Carotid stenosis measurements (when applicable) are obtained utilizing NASCET criteria, using the distal  internal carotid diameter as the denominator. CONTRAST:  50 cc Isovue 370. COMPARISON:  None. Per history of Dr. Hilda Blades, patient has [facility] CT scan read as a code stroke. These images are currently unavailable. This is being worked on by Haematologist. FINDINGS: CT HEAD Brain: Left middle cerebral artery distribution early infarct with poor collaterals as discussed below. This may end up being a large distribution infarct. Global atrophy. No intracranial hemorrhage. No intracranial mass. Calvarium and skull base: No fracture. Paranasal sinuses: Mucosal thickening maxillary sinuses greater on left measuring up to 1 cm. Orbits: Conjugate gaze to the left. CTA NECK Aortic arch: Common origin innominate and left common carotid artery. Plaque aortic arch. Plaque with mild narrowing proximal left subclavian artery. 2 cm above its origin, kink with mild to slightly  moderate narrowing left subclavian artery. Right carotid system: Moderate narrowing origin of the right common carotid artery. Plaque right carotid bifurcation with 50% maximal diameter stenosis. Left carotid system: Plaque left carotid bifurcation with less than 50% diameter stenosis. Vertebral arteries:Plaque with mild narrowing origin left vertebral artery. Ectatic proximal left vertebral artery. Left vertebral artery is the dominant vertebral artery. Right vertebral artery is congenitally small. Skeleton: No obvious fracture. Rotation C1 on C2 may be related to head positioning. Other neck: Heterogeneous large thyroid gland with substernal extension on left. No worrisome dominant mass. Upper chest: No worrisome lung apical lesion. CTA HEAD Anterior circulation: Plaque cavernous segment internal carotid artery bilaterally mild narrowing bilaterally. Fetal origin right posterior cerebral artery. Abrupt cut off of flow distal M1 segment left middle cerebral artery (thrombus) with poor collateral flow. Posterior circulation: Left vertebral artery is  dominant with mild narrowing. Right vertebral artery diminutive in size, irregular and narrowed throughout its course. Ectatic basilar artery with mild narrowing. Venous sinuses: Patent. Anatomic variants: As above. Delayed phase: Delayed filling in of left middle cerebral artery branches. IMPRESSION: CT HEAD Left middle cerebral artery distribution early infarct with poor collaterals as discussed below. This may end up being a large infarct. CTA NECK Common origin innominate and left common carotid artery. Plaque aortic arch. Plaque with mild narrowing proximal left subclavian artery. 2 cm above its origin, kink with mild to slightly moderate narrowing left subclavian artery. Moderate narrowing origin of the right common carotid artery. Plaque right carotid bifurcation with 50% maximal diameter stenosis. Plaque left carotid bifurcation with less than 50% diameter stenosis. Plaque with mild narrowing origin left vertebral artery. Ectatic proximal left vertebral artery. Left vertebral artery is the dominant vertebral artery. Right vertebral artery is congenitally small. No obvious fracture. Rotation C1 on C2 may be related to head positioning. Heterogeneous large thyroid gland with substernal extension on left. No worrisome dominant mass. CTA HEAD Abrupt cut off of flow distal M1 segment left middle cerebral artery (thrombus) with poor collateral flow. Delayed filling in of left middle cerebral artery branches. Plaque cavernous segment internal carotid artery bilaterally mild narrowing bilaterally. Fetal origin right posterior cerebral artery. Left vertebral artery is dominant with mild narrowing. Right vertebral artery diminutive in size, irregular and narrowed throughout its course. Ectatic basilar artery with mild narrowing. These results were called by telephone at the time of interpretation on 04/05/2016 at 5:47 pm to Dr. Rose Fillers , who verbally acknowledged these results. Electronically Signed   By: Lacy Duverney M.D.   On: 04/09/2016 18:19   Ct Angio Neck W Or Wo Contrast  Result Date: 04/29/2016 CLINICAL DATA:  82 year old female with altered mental status. Subsequent encounter. EXAM: CT ANGIOGRAPHY HEAD AND NECK TECHNIQUE: Multidetector CT imaging of the head and neck was performed using the standard protocol during bolus administration of intravenous contrast. Multiplanar CT image reconstructions and MIPs were obtained to evaluate the vascular anatomy. Carotid stenosis measurements (when applicable) are obtained utilizing NASCET criteria, using the distal internal carotid diameter as the denominator. CONTRAST:  50 cc Isovue 370. COMPARISON:  None. Per history of Dr. Hilda Blades, patient has [facility] CT scan read as a code stroke. These images are currently unavailable. This is being worked on by Haematologist. FINDINGS: CT HEAD Brain: Left middle cerebral artery distribution early infarct with poor collaterals as discussed below. This may end up being a large distribution infarct. Global atrophy. No intracranial hemorrhage. No intracranial mass. Calvarium and skull base: No fracture. Paranasal  sinuses: Mucosal thickening maxillary sinuses greater on left measuring up to 1 cm. Orbits: Conjugate gaze to the left. CTA NECK Aortic arch: Common origin innominate and left common carotid artery. Plaque aortic arch. Plaque with mild narrowing proximal left subclavian artery. 2 cm above its origin, kink with mild to slightly moderate narrowing left subclavian artery. Right carotid system: Moderate narrowing origin of the right common carotid artery. Plaque right carotid bifurcation with 50% maximal diameter stenosis. Left carotid system: Plaque left carotid bifurcation with less than 50% diameter stenosis. Vertebral arteries:Plaque with mild narrowing origin left vertebral artery. Ectatic proximal left vertebral artery. Left vertebral artery is the dominant vertebral artery. Right vertebral artery is congenitally small.  Skeleton: No obvious fracture. Rotation C1 on C2 may be related to head positioning. Other neck: Heterogeneous large thyroid gland with substernal extension on left. No worrisome dominant mass. Upper chest: No worrisome lung apical lesion. CTA HEAD Anterior circulation: Plaque cavernous segment internal carotid artery bilaterally mild narrowing bilaterally. Fetal origin right posterior cerebral artery. Abrupt cut off of flow distal M1 segment left middle cerebral artery (thrombus) with poor collateral flow. Posterior circulation: Left vertebral artery is dominant with mild narrowing. Right vertebral artery diminutive in size, irregular and narrowed throughout its course. Ectatic basilar artery with mild narrowing. Venous sinuses: Patent. Anatomic variants: As above. Delayed phase: Delayed filling in of left middle cerebral artery branches. IMPRESSION: CT HEAD Left middle cerebral artery distribution early infarct with poor collaterals as discussed below. This may end up being a large infarct. CTA NECK Common origin innominate and left common carotid artery. Plaque aortic arch. Plaque with mild narrowing proximal left subclavian artery. 2 cm above its origin, kink with mild to slightly moderate narrowing left subclavian artery. Moderate narrowing origin of the right common carotid artery. Plaque right carotid bifurcation with 50% maximal diameter stenosis. Plaque left carotid bifurcation with less than 50% diameter stenosis. Plaque with mild narrowing origin left vertebral artery. Ectatic proximal left vertebral artery. Left vertebral artery is the dominant vertebral artery. Right vertebral artery is congenitally small. No obvious fracture. Rotation C1 on C2 may be related to head positioning. Heterogeneous large thyroid gland with substernal extension on left. No worrisome dominant mass. CTA HEAD Abrupt cut off of flow distal M1 segment left middle cerebral artery (thrombus) with poor collateral flow. Delayed  filling in of left middle cerebral artery branches. Plaque cavernous segment internal carotid artery bilaterally mild narrowing bilaterally. Fetal origin right posterior cerebral artery. Left vertebral artery is dominant with mild narrowing. Right vertebral artery diminutive in size, irregular and narrowed throughout its course. Ectatic basilar artery with mild narrowing. These results were called by telephone at the time of interpretation on 2016/04/19 at 5:47 pm to Dr. Rose Fillers , who verbally acknowledged these results. Electronically Signed   By: Lacy Duverney M.D.   On: 2016-04-19 18:19   Dg Chest Portable 1 View  Result Date: 04/19/2016 CLINICAL DATA:  Stroke.  Possible aspiration. EXAM: PORTABLE CHEST 1 VIEW COMPARISON:  Chest CT dated 07/23/2015 and chest x-ray dated 07/09/2015 FINDINGS: Chronic mild cardiomegaly. Pulmonary vascularity is normal. No infiltrates or effusions. Accentuation of the interstitial markings at the bases is felt to be due to a relatively shallow inspiration. No acute bone abnormality. Calcification in the thoracic aorta. IMPRESSION: No acute abnormality.  Chronic mild cardiomegaly. Aortic atherosclerosis. Electronically Signed   By: Francene Boyers M.D.   On: 2016-04-19 18:25    Assessment/Plan:  Destiny Davis is a 82 year old patient who was  noted to fall at 2:30 PM. [facility] TPA was administered. She was at [facility]. CT angiogram revealed the presence of an occluded left middle cerebral artery. Destiny Davis remained aphasic with paresis of the right upper extremity.  Dr. Etta Quilleweshar was contacted. The case was discussed with the family. It appeared that Destiny Davis did have some baseline cognitive dysfunction however, it seems that this was relatively mild. It was ultimately decided that the benefits of interventional radiology outweighed any risk. The patient is being taken to interventional radiology  for further intervention.  Plan:  1. To interventional radiology for clot retrieval.  2. Destiny Davis will go to the intensive care unit after the procedure.  3. Routine ICU care.    Samrat Hayward A. Hilda BladesArmstrong, M.D. Neurohospitalist Phone: [phone]  04/29/2016, 6:54 PM

## 2016-04-08 ENCOUNTER — Encounter (HOSPITAL_COMMUNITY): Payer: Self-pay | Admitting: Interventional Radiology

## 2016-04-08 ENCOUNTER — Inpatient Hospital Stay (HOSPITAL_COMMUNITY): Payer: Medicare Other

## 2016-04-08 DIAGNOSIS — J9600 Acute respiratory failure, unspecified whether with hypoxia or hypercapnia: Secondary | ICD-10-CM

## 2016-04-08 DIAGNOSIS — G9340 Encephalopathy, unspecified: Secondary | ICD-10-CM

## 2016-04-08 DIAGNOSIS — E038 Other specified hypothyroidism: Secondary | ICD-10-CM

## 2016-04-08 LAB — CBC WITH DIFFERENTIAL/PLATELET
Basophils Absolute: 0 10*3/uL (ref 0.0–0.1)
Basophils Relative: 0 %
EOS ABS: 0 10*3/uL (ref 0.0–0.7)
Eosinophils Relative: 0 %
HEMATOCRIT: 38.9 % (ref 36.0–46.0)
HEMOGLOBIN: 12.7 g/dL (ref 12.0–15.0)
LYMPHS ABS: 0.4 10*3/uL — AB (ref 0.7–4.0)
LYMPHS PCT: 4 %
MCH: 30.1 pg (ref 26.0–34.0)
MCHC: 32.6 g/dL (ref 30.0–36.0)
MCV: 92.2 fL (ref 78.0–100.0)
MONOS PCT: 4 %
Monocytes Absolute: 0.5 10*3/uL (ref 0.1–1.0)
NEUTROS PCT: 92 %
Neutro Abs: 11.4 10*3/uL — ABNORMAL HIGH (ref 1.7–7.7)
Platelets: 206 10*3/uL (ref 150–400)
RBC: 4.22 MIL/uL (ref 3.87–5.11)
RDW: 13.8 % (ref 11.5–15.5)
WBC: 12.3 10*3/uL — AB (ref 4.0–10.5)

## 2016-04-08 LAB — BASIC METABOLIC PANEL
Anion gap: 12 (ref 5–15)
BUN: 10 mg/dL (ref 6–20)
CHLORIDE: 106 mmol/L (ref 101–111)
CO2: 21 mmol/L — AB (ref 22–32)
Calcium: 8.1 mg/dL — ABNORMAL LOW (ref 8.9–10.3)
Creatinine, Ser: 0.91 mg/dL (ref 0.44–1.00)
GFR calc Af Amer: >60 mL/min (ref >60)
GFR calc non Af Amer: 52 mL/min — ABNORMAL LOW (ref >60)
GLUCOSE: 196 mg/dL — AB (ref 65–99)
POTASSIUM: 3.5 mmol/L (ref 3.5–5.1)
SODIUM: 139 mmol/L (ref 135–145)

## 2016-04-08 LAB — COMPREHENSIVE METABOLIC PANEL
ALK PHOS: 41 U/L (ref 38–126)
ALT: 14 U/L (ref 14–54)
ANION GAP: 10 (ref 5–15)
AST: 25 U/L (ref 15–41)
Albumin: 3.2 g/dL — ABNORMAL LOW (ref 3.5–5.0)
BILIRUBIN TOTAL: 1.1 mg/dL (ref 0.3–1.2)
BUN: 11 mg/dL (ref 6–20)
CHLORIDE: 107 mmol/L (ref 101–111)
CO2: 21 mmol/L — ABNORMAL LOW (ref 22–32)
CREATININE: 0.94 mg/dL (ref 0.44–1.00)
Calcium: 8.1 mg/dL — ABNORMAL LOW (ref 8.9–10.3)
GFR, EST AFRICAN AMERICAN: 58 mL/min — AB (ref >60)
GFR, EST NON AFRICAN AMERICAN: 50 mL/min — AB (ref >60)
Glucose, Bld: 226 mg/dL — ABNORMAL HIGH (ref 65–99)
POTASSIUM: 3.2 mmol/L — AB (ref 3.5–5.1)
SODIUM: 138 mmol/L (ref 135–145)
Total Protein: 5.7 g/dL — ABNORMAL LOW (ref 6.5–8.1)

## 2016-04-08 LAB — LIPID PANEL
Cholesterol: 117 mg/dL (ref 0–200)
HDL: 45 mg/dL (ref >40)
LDL Cholesterol: 60 mg/dL (ref 0–99)
TRIGLYCERIDES: 58 mg/dL (ref <150)
Total CHOL/HDL Ratio: 2.6 RATIO
VLDL: 12 mg/dL (ref 0–40)

## 2016-04-08 LAB — GLUCOSE, CAPILLARY
GLUCOSE-CAPILLARY: 123 mg/dL — AB (ref 65–99)
GLUCOSE-CAPILLARY: 132 mg/dL — AB (ref 65–99)
GLUCOSE-CAPILLARY: 140 mg/dL — AB (ref 65–99)
Glucose-Capillary: 136 mg/dL — ABNORMAL HIGH (ref 65–99)

## 2016-04-08 LAB — PHOSPHORUS
PHOSPHORUS: 2.7 mg/dL (ref 2.5–4.6)
Phosphorus: 3 mg/dL (ref 2.5–4.6)

## 2016-04-08 LAB — MAGNESIUM
MAGNESIUM: 1.8 mg/dL (ref 1.7–2.4)
Magnesium: 1.8 mg/dL (ref 1.7–2.4)

## 2016-04-08 LAB — CBC
HEMATOCRIT: 35.5 % — AB (ref 36.0–46.0)
Hemoglobin: 11.5 g/dL — ABNORMAL LOW (ref 12.0–15.0)
MCH: 29.6 pg (ref 26.0–34.0)
MCHC: 32.4 g/dL (ref 30.0–36.0)
MCV: 91.5 fL (ref 78.0–100.0)
Platelets: 178 10*3/uL (ref 150–400)
RBC: 3.88 MIL/uL (ref 3.87–5.11)
RDW: 13.8 % (ref 11.5–15.5)
WBC: 11.3 10*3/uL — ABNORMAL HIGH (ref 4.0–10.5)

## 2016-04-08 LAB — MRSA PCR SCREENING: MRSA by PCR: NEGATIVE

## 2016-04-08 LAB — TSH: TSH: 5.32 u[IU]/mL — ABNORMAL HIGH (ref 0.350–4.500)

## 2016-04-08 MED ORDER — MIDAZOLAM HCL 2 MG/2ML IJ SOLN
1.0000 mg | INTRAMUSCULAR | Status: DC | PRN
  Administered 2016-04-09: 1 mg via INTRAVENOUS
  Filled 2016-04-08: qty 2

## 2016-04-08 MED ORDER — HYDRALAZINE HCL 20 MG/ML IJ SOLN: 10.0000 mg | INTRAMUSCULAR | Status: DC | PRN

## 2016-04-08 MED ORDER — INSULIN ASPART 100 UNIT/ML ~~LOC~~ SOLN
0.0000 [IU] | SUBCUTANEOUS | Status: DC
  Administered 2016-04-08 – 2016-04-10 (×10): 1 [IU] via SUBCUTANEOUS

## 2016-04-08 MED ORDER — MIDAZOLAM HCL 2 MG/2ML IJ SOLN: 1.0000 mg | INTRAMUSCULAR | Status: DC | PRN

## 2016-04-08 MED ORDER — VITAL HIGH PROTEIN PO LIQD: 1000.0000 mL | ORAL | Status: DC

## 2016-04-08 MED ORDER — FENTANYL CITRATE (PF) 100 MCG/2ML IJ SOLN
50.0000 ug | INTRAMUSCULAR | Status: DC | PRN
  Filled 2016-04-08 (×2): qty 2

## 2016-04-08 MED ORDER — PRO-STAT SUGAR FREE PO LIQD: 30.0000 mL | Freq: Two times a day (BID) | ORAL | Status: DC

## 2016-04-08 MED ORDER — ATORVASTATIN CALCIUM 10 MG PO TABS
10.0000 mg | ORAL_TABLET | Freq: Every day | ORAL | Status: DC
  Administered 2016-04-08: 10 mg
  Filled 2016-04-08: qty 1

## 2016-04-08 MED ORDER — ANTISEPTIC ORAL RINSE SOLUTION (CORINZ)
7.0000 mL | OROMUCOSAL | Status: DC
  Administered 2016-04-08 – 2016-04-10 (×25): 7 mL via OROMUCOSAL

## 2016-04-08 MED ORDER — FENTANYL CITRATE (PF) 100 MCG/2ML IJ SOLN
50.0000 ug | INTRAMUSCULAR | Status: DC | PRN
  Administered 2016-04-08 – 2016-04-09 (×5): 50 ug via INTRAVENOUS
  Filled 2016-04-08 (×3): qty 2

## 2016-04-08 MED ORDER — VITAL AF 1.2 CAL PO LIQD
1000.0000 mL | ORAL | Status: DC
  Administered 2016-04-08 – 2016-04-09 (×2): 1000 mL

## 2016-04-08 MED ORDER — POTASSIUM CHLORIDE 20 MEQ/15ML (10%) PO SOLN
40.0000 meq | Freq: Once | ORAL | Status: AC
  Administered 2016-04-08: 40 meq
  Filled 2016-04-08: qty 30

## 2016-04-08 MED ORDER — LEVOTHYROXINE SODIUM 75 MCG PO TABS
150.0000 ug | ORAL_TABLET | Freq: Every day | ORAL | Status: DC
  Administered 2016-04-09 – 2016-04-10 (×2): 150 ug via ORAL
  Filled 2016-04-08 (×2): qty 2

## 2016-04-08 MED ORDER — LEVOTHYROXINE SODIUM 100 MCG IV SOLR: 75.0000 ug | Freq: Every day | INTRAVENOUS | Status: DC

## 2016-04-08 MED ORDER — CHLORHEXIDINE GLUCONATE 0.12% ORAL RINSE (MEDLINE KIT)
15.0000 mL | Freq: Two times a day (BID) | OROMUCOSAL | Status: DC
  Administered 2016-04-08 – 2016-04-10 (×5): 15 mL via OROMUCOSAL

## 2016-04-08 NOTE — Progress Notes (Signed)
419fr sheath removed at 930am using manual pressure and vpad.  Hemostasis obtained at 1000.  Groin site reviewed with Idelle Leechonnie RN.  No hematoma. Pressure dressing applied.

## 2016-04-08 NOTE — Progress Notes (Signed)
Referring Physician(s): DR Delia HeadyPramod Sethi  Supervising Physician: Julieanne Cottoneveshwar, Sanjeev  Patient Status:  Inpatient  Chief Complaint:  S/P endovascular treatment of LT MCA occlusion ,with minimal revascularization. x1pass with solitaire 4 mm x 40 mm retrieval device and multiple attempts  With  mechanical thrombolysis.   Subjective:  CVA L MCA revascularization in IR 8/9 pm On vent Moving all 4s spontaneously Not following commands  Allergies: Review of patient's allergies indicates no known allergies.  Medications: Prior to Admission medications   Not on File     Vital Signs: BP 132/77   Pulse 71   Temp 97.7 F (36.5 C) (Axillary)   Resp 19   Wt 132 lb 0.9 oz (59.9 kg)   SpO2 100%   Physical Exam  Pulmonary/Chest:  vent  Musculoskeletal:  Rt groin sheath intact Soft No bleeding No hematoma Rt foot 2+ pulses  Moves all 4s in bed--not following command    Imaging: Ct Angio Head W Or Wo Contrast  Result Date: 03/30/2016 CLINICAL DATA:  82 year old female with altered mental status. Subsequent encounter. EXAM: CT ANGIOGRAPHY HEAD AND NECK TECHNIQUE: Multidetector CT imaging of the head and neck was performed using the standard protocol during bolus administration of intravenous contrast. Multiplanar CT image reconstructions and MIPs were obtained to evaluate the vascular anatomy. Carotid stenosis measurements (when applicable) are obtained utilizing NASCET criteria, using the distal internal carotid diameter as the denominator. CONTRAST:  50 cc Isovue 370. COMPARISON:  None. Per history of Dr. Hilda BladesArmstrong[facility]. These images are currently unavailable. This is being worked on by Haematologiststaff. FINDINGS: CT HEAD Brain: Left middle cerebral artery distribution early infarct with poor collaterals as discussed below. This may end up being a large distribution infarct. Global atrophy. No intracranial hemorrhage. No  intracranial mass. Calvarium and skull base: No fracture. Paranasal sinuses: Mucosal thickening maxillary sinuses greater on left measuring up to 1 cm. Orbits: Conjugate gaze to the left. CTA NECK Aortic arch: Common origin innominate and left common carotid artery. Plaque aortic arch. Plaque with mild narrowing proximal left subclavian artery. 2 cm above its origin, kink with mild to slightly moderate narrowing left subclavian artery. Right carotid system: Moderate narrowing origin of the right common carotid artery. Plaque right carotid bifurcation with 50% maximal diameter stenosis. Left carotid system: Plaque left carotid bifurcation with less than 50% diameter stenosis. Vertebral arteries:Plaque with mild narrowing origin left vertebral artery. Ectatic proximal left vertebral artery. Left vertebral artery is the dominant vertebral artery. Right vertebral artery is congenitally small. Skeleton: No obvious fracture. Rotation C1 on C2 may be related to head positioning. Other neck: Heterogeneous large thyroid gland with substernal extension on left. No worrisome dominant mass. Upper chest: No worrisome lung apical lesion. CTA HEAD Anterior circulation: Plaque cavernous segment internal carotid artery bilaterally mild narrowing bilaterally. Fetal origin right posterior cerebral artery. Abrupt cut off of flow distal M1 segment left middle cerebral artery (thrombus) with poor collateral flow. Posterior circulation: Left vertebral artery is dominant with mild narrowing. Right vertebral artery diminutive in size, irregular and narrowed throughout its course. Ectatic basilar artery with mild narrowing. Venous sinuses: Patent. Anatomic variants: As above. Delayed phase: Delayed filling in of left middle cerebral artery branches. IMPRESSION: CT HEAD Left middle cerebral artery distribution early infarct with poor collaterals as discussed below. This may end up being a large infarct. CTA NECK Common origin innominate and  left common carotid artery. Plaque aortic arch.  Plaque with mild narrowing proximal left subclavian artery. 2 cm above its origin, kink with mild to slightly moderate narrowing left subclavian artery. Moderate narrowing origin of the right common carotid artery. Plaque right carotid bifurcation with 50% maximal diameter stenosis. Plaque left carotid bifurcation with less than 50% diameter stenosis. Plaque with mild narrowing origin left vertebral artery. Ectatic proximal left vertebral artery. Left vertebral artery is the dominant vertebral artery. Right vertebral artery is congenitally small. No obvious fracture. Rotation C1 on C2 may be related to head positioning. Heterogeneous large thyroid gland with substernal extension on left. No worrisome dominant mass. CTA HEAD Abrupt cut off of flow distal M1 segment left middle cerebral artery (thrombus) with poor collateral flow. Delayed filling in of left middle cerebral artery branches. Plaque cavernous segment internal carotid artery bilaterally mild narrowing bilaterally. Fetal origin right posterior cerebral artery. Left vertebral artery is dominant with mild narrowing. Right vertebral artery diminutive in size, irregular and narrowed throughout its course. Ectatic basilar artery with mild narrowing. These results were called by telephone at the time of interpretation on 2016-04-19 at 5:47 pm to Dr. Rose Fillers , who verbally acknowledged these results. Electronically Signed   By: Lacy Duverney M.D.   On: 04-19-2016 18:19   Ct Head Wo Contrast  Result Date: 04/19/16 CLINICAL DATA:  Initial evaluation for acute left MCA embolism, status post endovascular intervention with minimal revascularization. EXAM: CT HEAD WITHOUT CONTRAST TECHNIQUE: Contiguous axial images were obtained from the base of the skull through the vertex without intravenous contrast. COMPARISON:  Prior CT and CTA from earlier the same day. FINDINGS: Increased hyperdensity within the left M1  segment, likely reflecting residual thrombus and/ or IV contrast from recent angiogram. Subtle hyperdensity within the cortical gray matter of the anterior left frontal lobe more superiorly towards the vertex likely reflects associated contrast staining. Minimal contrast staining within the left basal ganglia as well. There is subtle hypodensity within the left insular cortex and left temporal lobe, concerning for evolving left MCA territory infarct. Probable developing ischemia within the left temporal lobe and anterior left frontal lobe. No significant mass effect at this time. No definite acute intracranial hemorrhage. Gray-white matter differentiation otherwise maintained. No other acute large vessel territory infarct. No mass lesion or midline shift. No hydrocephalus. No extra-axial fluid collection. Atrophy with chronic microvascular ischemic changes again noted. Scalp soft tissues demonstrate no acute abnormality. Globes and orbits within normal limits. Senescent ocular calcifications noted. Scattered mucosal thickening within the ethmoidal air cells, right sphenoid sinus, and maxillary sinuses. Patient is intubated. No mastoid effusion. Calvarium intact. IMPRESSION: 1. Subtle loss of gray-white matter differentiation with hypodensity within the left MCA territory as above, compatible with evolving left MCA territory infarct. 2. Hyperdensity within the left M1 segment, which may reflect residual thrombus and/or contrast from recent arteriogram. Associated contrast staining within the left basal ganglia and anterior left frontal lobe. No definite acute intracranial hemorrhage. 3. No other acute intracranial process. Electronically Signed   By: Rise Mu M.D.   On: 04/19/16 22:37   Ct Angio Neck W Or Wo Contrast  Result Date: 04-19-2016 CLINICAL DATA:  82 year old female with altered mental status. Subsequent encounter. EXAM: CT ANGIOGRAPHY HEAD AND NECK TECHNIQUE: Multidetector CT imaging of  the head and neck was performed using the standard protocol during bolus administration of intravenous contrast. Multiplanar CT image reconstructions and MIPs were obtained to evaluate the vascular anatomy. Carotid stenosis measurements (when applicable) are obtained utilizing NASCET criteria, using the  distal internal carotid diameter as the denominator. CONTRAST:  50 cc Isovue 370. COMPARISON:  None. Per history of Dr. Hilda Blades, patient has [facility] CT scan read as a code stroke. These images are currently unavailable. This is being worked on by Haematologist. FINDINGS: CT HEAD Brain: Left middle cerebral artery distribution early infarct with poor collaterals as discussed below. This may end up being a large distribution infarct. Global atrophy. No intracranial hemorrhage. No intracranial mass. Calvarium and skull base: No fracture. Paranasal sinuses: Mucosal thickening maxillary sinuses greater on left measuring up to 1 cm. Orbits: Conjugate gaze to the left. CTA NECK Aortic arch: Common origin innominate and left common carotid artery. Plaque aortic arch. Plaque with mild narrowing proximal left subclavian artery. 2 cm above its origin, kink with mild to slightly moderate narrowing left subclavian artery. Right carotid system: Moderate narrowing origin of the right common carotid artery. Plaque right carotid bifurcation with 50% maximal diameter stenosis. Left carotid system: Plaque left carotid bifurcation with less than 50% diameter stenosis. Vertebral arteries:Plaque with mild narrowing origin left vertebral artery. Ectatic proximal left vertebral artery. Left vertebral artery is the dominant vertebral artery. Right vertebral artery is congenitally small. Skeleton: No obvious fracture. Rotation C1 on C2 may be related to head positioning. Other neck: Heterogeneous large thyroid gland with substernal extension on left. No worrisome dominant mass. Upper chest: No worrisome lung apical lesion. CTA HEAD  Anterior circulation: Plaque cavernous segment internal carotid artery bilaterally mild narrowing bilaterally. Fetal origin right posterior cerebral artery. Abrupt cut off of flow distal M1 segment left middle cerebral artery (thrombus) with poor collateral flow. Posterior circulation: Left vertebral artery is dominant with mild narrowing. Right vertebral artery diminutive in size, irregular and narrowed throughout its course. Ectatic basilar artery with mild narrowing. Venous sinuses: Patent. Anatomic variants: As above. Delayed phase: Delayed filling in of left middle cerebral artery branches. IMPRESSION: CT HEAD Left middle cerebral artery distribution early infarct with poor collaterals as discussed below. This may end up being a large infarct. CTA NECK Common origin innominate and left common carotid artery. Plaque aortic arch. Plaque with mild narrowing proximal left subclavian artery. 2 cm above its origin, kink with mild to slightly moderate narrowing left subclavian artery. Moderate narrowing origin of the right common carotid artery. Plaque right carotid bifurcation with 50% maximal diameter stenosis. Plaque left carotid bifurcation with less than 50% diameter stenosis. Plaque with mild narrowing origin left vertebral artery. Ectatic proximal left vertebral artery. Left vertebral artery is the dominant vertebral artery. Right vertebral artery is congenitally small. No obvious fracture. Rotation C1 on C2 may be related to head positioning. Heterogeneous large thyroid gland with substernal extension on left. No worrisome dominant mass. CTA HEAD Abrupt cut off of flow distal M1 segment left middle cerebral artery (thrombus) with poor collateral flow. Delayed filling in of left middle cerebral artery branches. Plaque cavernous segment internal carotid artery bilaterally mild narrowing bilaterally. Fetal origin right posterior cerebral artery. Left vertebral artery is dominant with mild narrowing. Right  vertebral artery diminutive in size, irregular and narrowed throughout its course. Ectatic basilar artery with mild narrowing. These results were called by telephone at the time of interpretation on 04-28-2016 at 5:47 pm to Dr. Rose Fillers , who verbally acknowledged these results. Electronically Signed   By: Lacy Duverney M.D.   On: Apr 28, 2016 18:19   Dg Chest Portable 1 View  Result Date: 2016/04/28 CLINICAL DATA:  Stroke.  Possible aspiration. EXAM: PORTABLE CHEST 1 VIEW COMPARISON:  Chest CT dated 07/23/2015 and chest x-ray dated 07/09/2015 FINDINGS: Chronic mild cardiomegaly. Pulmonary vascularity is normal. No infiltrates or effusions. Accentuation of the interstitial markings at the bases is felt to be due to a relatively shallow inspiration. No acute bone abnormality. Calcification in the thoracic aorta. IMPRESSION: No acute abnormality.  Chronic mild cardiomegaly. Aortic atherosclerosis. Electronically Signed   By: Francene Boyers M.D.   On: 04/16/2016 18:25    Labs:  CBC:  Recent Labs  04/08/16 0030 04/08/16 0430  WBC 11.3* 12.3*  HGB 11.5* 12.7  HCT 35.5* 38.9  PLT 178 206    COAGS: No results for input(s): INR, APTT in the last 8760 hours.  BMP:  Recent Labs  04/08/16 0140 04/08/16 0430  NA 138 139  K 3.2* 3.5  CL 107 106  CO2 21* 21*  GLUCOSE 226* 196*  BUN 11 10  CALCIUM 8.1* 8.1*  CREATININE 0.94 0.91  GFRNONAA 50* 52*  GFRAA 58* >60    LIVER FUNCTION TESTS:  Recent Labs  04/08/16 0140  BILITOT 1.1  AST 25  ALT 14  ALKPHOS 41  PROT 5.7*  ALBUMIN 3.2*    Assessment and Plan:  CVA L MCA Revascularization 8/9 On vent Will report to Dr Corliss Skains Plan per Stroke Team  Electronically Signed: Ralene Muskrat A 04/08/2016, 9:35 AM   I spent a total of 15 Minutes at the the patient's bedside AND on the patient's hospital floor or unit, greater than 50% of which was counseling/coordinating care for CVA/ L MCA revasc

## 2016-04-08 NOTE — Care Management Important Message (Signed)
Important Message  Patient Details  Name: Destiny Davis MRN: [id number] Date of Birth: 1919/07/22   Medicare Important Message Given:  Yes    Bernadette HoitShoffner, Jaiyon Wander Coleman 04/08/2016, 8:24 AM

## 2016-04-08 NOTE — Progress Notes (Signed)
STROKE TEAM PROGRESS NOTE   HISTORY OF PRESENT ILLNESS (per record) Destiny Davis is an 82 y.o. female who presents as a transfer from [place]. It is reported that at 2:30p this afternoon 04/06/2016 she fell and was experiencing a facial and right hemiparesis (LKW). She arrived in the emergency room within the time window for TPA. The telemetry neurologist recommended administration of TPA. After initiating tPA therapy the ER physician requested transfer of the patient to [facility] emergency room for further evaluation and treatment.  Upon arrival the patient was taken to CTA for further evaluation. 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 This was discussed with the family. Destiny Davis's baseline status was discussed in length. The family reports that Destiny Davis lived independently and was able to take care of herself in all aspects. He report she was able to prepare her own meals bathing herself clothing herself and otherwise was functionally independent. We discussed the fact that she had been previously seen by neurologist and was noted to have some issues with her cognitive function. It was also noted that at some point within the past year she was started on Namenda. The family reports that they felt her symptoms were mild and that the Namenda provided no benefit. It was discontinued by the family. The family reports that Destiny Davis does not drive or maintain her own finances. However they report she was not actually having any difficulty driving there was simply concerned about her vision and her age. They assisted with her finances as they felt they were somewhat complex. However they report that she would still write her own checks.  The possibility of interventional radiology was discussed with the family at length. All 3 children felt that they would like for [place] to proceed with interventional therapy. The case was discussed with Dr. Shan Levans. Dr. Shan Levans came to the hospital  and discussed the risk and benefits of interventional neuroradiology with the family. After thorough discussion of the risk and benefits of the procedure the family requested additional intervention. The patient was subsequently taken to the interventional radiology suite .S/P endovascular treatment of LT MCA occlusion ,with minimal revascularization. x1pass with solitaire 4 mm x 40 mm retrieval device and multiple attempts  With  mechanical thrombolysis She was admitted to the neuro ICU for further evaluation and treatment.   SUBJECTIVE (INTERVAL HISTORY) Her daughters (x2) and 1 son are present (there is another son on the way) at the bedside.  IR tech removing sheath. RNs also at bedside. Patient is intubated, awake, aphasic with R hemiparesis.   OBJECTIVE Temp:  [97.3 F (36.3 C)-98.3 F (36.8 C)] 98.3 F (36.8 C) (08/10 1200) Pulse Rate:  [57-122] 59 (08/10 1300) Cardiac Rhythm: Normal sinus rhythm (08/10 0800) Resp:  [14-25] 16 (08/10 1300) BP: (71-171)/(49-106) 111/54 (08/10 1300) SpO2:  [94 %-100 %] 100 % (08/10 1300) Arterial Line BP: (103-222)/(48-131) 120/69 (08/10 0900) FiO2 (%):  [40 %-60 %] 40 % (08/10 1154) Weight:  [132 lb 0.9 oz (59.9 kg)] 132 lb 0.9 oz (59.9 kg) (08/09 2010)  CBC:   Recent Labs Lab 04/08/16 0030 04/08/16 0430  WBC 11.3* 12.3*  NEUTROABS  --  11.4*  HGB 11.5* 12.7  HCT 35.5* 38.9  MCV 91.5 92.2  PLT 178 206    Basic Metabolic Panel:   Recent Labs Lab 04/08/16 0140 04/08/16 0430 04/08/16 1016  NA 138 139  --   K 3.2* 3.5  --   CL 107 106  --  CO2 21* 21*  --   GLUCOSE 226* 196*  --   BUN 11 10  --   CREATININE 0.94 0.91  --   CALCIUM 8.1* 8.1*  --   MG  --   --  1.8  PHOS  --   --  3.0    Lipid Panel:     Component Value Date/Time   CHOL 117 04/08/2016 0430   TRIG 58 04/08/2016 0430   HDL 45 04/08/2016 0430   CHOLHDL 2.6 04/08/2016 0430   VLDL 12 04/08/2016 0430   LDLCALC 60 04/08/2016 0430   HgbA1c: No results found  for: HGBA1C Urine Drug Screen: No results found for: LABOPIA, COCAINSCRNUR, LABBENZ, AMPHETMU, THCU, LABBARB    IMAGING  Ct Angio Head W Or Wo Contrast  Result Date: 04/21/2016 CLINICAL DATA:  82 year old female with altered mental status. Subsequent encounter. EXAM: CT ANGIOGRAPHY HEAD AND NECK TECHNIQUE: Multidetector CT imaging of the head and neck was performed using the standard protocol during bolus administration of intravenous contrast. Multiplanar CT image reconstructions and MIPs were obtained to evaluate the vascular anatomy. Carotid stenosis measurements (when applicable) are obtained utilizing NASCET criteria, using the distal internal carotid diameter as the denominator. CONTRAST:  50 cc Isovue 370. COMPARISON:  None. Per history of Dr. Hilda BladesArmstrong, [facility]. These images are currently unavailable. This is being worked on by Haematologiststaff. FINDINGS: CT HEAD Brain: Left middle cerebral artery distribution early infarct with poor collaterals as discussed below. This may end up being a large distribution infarct. Global atrophy. No intracranial hemorrhage. No intracranial mass. Calvarium and skull base: No fracture. Paranasal sinuses: Mucosal thickening maxillary sinuses greater on left measuring up to 1 cm. Orbits: Conjugate gaze to the left. CTA NECK Aortic arch: Common origin innominate and left common carotid artery. Plaque aortic arch. Plaque with mild narrowing proximal left subclavian artery. 2 cm above its origin, kink with mild to slightly moderate narrowing left subclavian artery. Right carotid system: Moderate narrowing origin of the right common carotid artery. Plaque right carotid bifurcation with 50% maximal diameter stenosis. Left carotid system: Plaque left carotid bifurcation with less than 50% diameter stenosis. Vertebral arteries:Plaque with mild narrowing origin left vertebral artery. Ectatic proximal left vertebral artery. Left vertebral  artery is the dominant vertebral artery. Right vertebral artery is congenitally small. Skeleton: No obvious fracture. Rotation C1 on C2 may be related to head positioning. Other neck: Heterogeneous large thyroid gland with substernal extension on left. No worrisome dominant mass. Upper chest: No worrisome lung apical lesion. CTA HEAD Anterior circulation: Plaque cavernous segment internal carotid artery bilaterally mild narrowing bilaterally. Fetal origin right posterior cerebral artery. Abrupt cut off of flow distal M1 segment left middle cerebral artery (thrombus) with poor collateral flow. Posterior circulation: Left vertebral artery is dominant with mild narrowing. Right vertebral artery diminutive in size, irregular and narrowed throughout its course. Ectatic basilar artery with mild narrowing. Venous sinuses: Patent. Anatomic variants: As above. Delayed phase: Delayed filling in of left middle cerebral artery branches. IMPRESSION: CT HEAD Left middle cerebral artery distribution early infarct with poor collaterals as discussed below. This may end up being a large infarct. CTA NECK Common origin innominate and left common carotid artery. Plaque aortic arch. Plaque with mild narrowing proximal left subclavian artery. 2 cm above its origin, kink with mild to slightly moderate narrowing left subclavian artery. Moderate narrowing origin of the right common carotid artery. Plaque right carotid bifurcation with 50%  maximal diameter stenosis. Plaque left carotid bifurcation with less than 50% diameter stenosis. Plaque with mild narrowing origin left vertebral artery. Ectatic proximal left vertebral artery. Left vertebral artery is the dominant vertebral artery. Right vertebral artery is congenitally small. No obvious fracture. Rotation C1 on C2 may be related to head positioning. Heterogeneous large thyroid gland with substernal extension on left. No worrisome dominant mass. CTA HEAD Abrupt cut off of flow distal M1  segment left middle cerebral artery (thrombus) with poor collateral flow. Delayed filling in of left middle cerebral artery branches. Plaque cavernous segment internal carotid artery bilaterally mild narrowing bilaterally. Fetal origin right posterior cerebral artery. Left vertebral artery is dominant with mild narrowing. Right vertebral artery diminutive in size, irregular and narrowed throughout its course. Ectatic basilar artery with mild narrowing. These results were called by telephone at the time of interpretation on 04/25/16 at 5:47 pm to Dr. Rose Fillers , who verbally acknowledged these results. Electronically Signed   By: Lacy Duverney M.D.   On: 2016/04/25 18:19   Ct Head Wo Contrast  Result Date: 2016/04/25 CLINICAL DATA:  Initial evaluation for acute left MCA embolism, status post endovascular intervention with minimal revascularization. EXAM: CT HEAD WITHOUT CONTRAST TECHNIQUE: Contiguous axial images were obtained from the base of the skull through the vertex without intravenous contrast. COMPARISON:  Prior CT and CTA from earlier the same day. FINDINGS: Increased hyperdensity within the left M1 segment, likely reflecting residual thrombus and/ or IV contrast from recent angiogram. Subtle hyperdensity within the cortical gray matter of the anterior left frontal lobe more superiorly towards the vertex likely reflects associated contrast staining. Minimal contrast staining within the left basal ganglia as well. There is subtle hypodensity within the left insular cortex and left temporal lobe, concerning for evolving left MCA territory infarct. Probable developing ischemia within the left temporal lobe and anterior left frontal lobe. No significant mass effect at this time. No definite acute intracranial hemorrhage. Gray-white matter differentiation otherwise maintained. No other acute large vessel territory infarct. No mass lesion or midline shift. No hydrocephalus. No extra-axial fluid  collection. Atrophy with chronic microvascular ischemic changes again noted. Scalp soft tissues demonstrate no acute abnormality. Globes and orbits within normal limits. Senescent ocular calcifications noted. Scattered mucosal thickening within the ethmoidal air cells, right sphenoid sinus, and maxillary sinuses. Patient is intubated. No mastoid effusion. Calvarium intact. IMPRESSION: 1. Subtle loss of gray-white matter differentiation with hypodensity within the left MCA territory as above, compatible with evolving left MCA territory infarct. 2. Hyperdensity within the left M1 segment, which may reflect residual thrombus and/or contrast from recent arteriogram. Associated contrast staining within the left basal ganglia and anterior left frontal lobe. No definite acute intracranial hemorrhage. 3. No other acute intracranial process. Electronically Signed   By: Rise Mu M.D.   On: 04-25-2016 22:37   Ct Angio Neck W Or Wo Contrast  Result Date: 04/25/2016 CLINICAL DATA:  82 year old female with altered mental status. Subsequent encounter. EXAM: CT ANGIOGRAPHY HEAD AND NECK TECHNIQUE: Multidetector CT imaging of the head and neck was performed using the standard protocol during bolus administration of intravenous contrast. Multiplanar CT image reconstructions and MIPs were obtained to evaluate the vascular anatomy. Carotid stenosis measurements (when applicable) are obtained utilizing NASCET criteria, using the distal internal carotid diameter as the denominator. CONTRAST:  50 cc Isovue 370. COMPARISON:  None. Per history of Dr. Hilda Blades, patient has [facility] CT scan read as a code stroke. These images are currently unavailable. This  is being worked on by Haematologist. FINDINGS: CT HEAD Brain: Left middle cerebral artery distribution early infarct with poor collaterals as discussed below. This may end up being a large distribution infarct. Global atrophy. No intracranial hemorrhage. No intracranial  mass. Calvarium and skull base: No fracture. Paranasal sinuses: Mucosal thickening maxillary sinuses greater on left measuring up to 1 cm. Orbits: Conjugate gaze to the left. CTA NECK Aortic arch: Common origin innominate and left common carotid artery. Plaque aortic arch. Plaque with mild narrowing proximal left subclavian artery. 2 cm above its origin, kink with mild to slightly moderate narrowing left subclavian artery. Right carotid system: Moderate narrowing origin of the right common carotid artery. Plaque right carotid bifurcation with 50% maximal diameter stenosis. Left carotid system: Plaque left carotid bifurcation with less than 50% diameter stenosis. Vertebral arteries:Plaque with mild narrowing origin left vertebral artery. Ectatic proximal left vertebral artery. Left vertebral artery is the dominant vertebral artery. Right vertebral artery is congenitally small. Skeleton: No obvious fracture. Rotation C1 on C2 may be related to head positioning. Other neck: Heterogeneous large thyroid gland with substernal extension on left. No worrisome dominant mass. Upper chest: No worrisome lung apical lesion. CTA HEAD Anterior circulation: Plaque cavernous segment internal carotid artery bilaterally mild narrowing bilaterally. Fetal origin right posterior cerebral artery. Abrupt cut off of flow distal M1 segment left middle cerebral artery (thrombus) with poor collateral flow. Posterior circulation: Left vertebral artery is dominant with mild narrowing. Right vertebral artery diminutive in size, irregular and narrowed throughout its course. Ectatic basilar artery with mild narrowing. Venous sinuses: Patent. Anatomic variants: As above. Delayed phase: Delayed filling in of left middle cerebral artery branches. IMPRESSION: CT HEAD Left middle cerebral artery distribution early infarct with poor collaterals as discussed below. This may end up being a large infarct. CTA NECK Common origin innominate and left common  carotid artery. Plaque aortic arch. Plaque with mild narrowing proximal left subclavian artery. 2 cm above its origin, kink with mild to slightly moderate narrowing left subclavian artery. Moderate narrowing origin of the right common carotid artery. Plaque right carotid bifurcation with 50% maximal diameter stenosis. Plaque left carotid bifurcation with less than 50% diameter stenosis. Plaque with mild narrowing origin left vertebral artery. Ectatic proximal left vertebral artery. Left vertebral artery is the dominant vertebral artery. Right vertebral artery is congenitally small. No obvious fracture. Rotation C1 on C2 may be related to head positioning. Heterogeneous large thyroid gland with substernal extension on left. No worrisome dominant mass. CTA HEAD Abrupt cut off of flow distal M1 segment left middle cerebral artery (thrombus) with poor collateral flow. Delayed filling in of left middle cerebral artery branches. Plaque cavernous segment internal carotid artery bilaterally mild narrowing bilaterally. Fetal origin right posterior cerebral artery. Left vertebral artery is dominant with mild narrowing. Right vertebral artery diminutive in size, irregular and narrowed throughout its course. Ectatic basilar artery with mild narrowing. These results were called by telephone at the time of interpretation on 04/06/2016 at 5:47 pm to Dr. Rose Fillers , who verbally acknowledged these results. Electronically Signed   By: Lacy Duverney M.D.   On: 04/09/2016 18:19   Dg Chest Portable 1 View  Result Date: 03/31/2016 CLINICAL DATA:  Stroke.  Possible aspiration. EXAM: PORTABLE CHEST 1 VIEW COMPARISON:  Chest CT dated 07/23/2015 and chest x-ray dated 07/09/2015 FINDINGS: Chronic mild cardiomegaly. Pulmonary vascularity is normal. No infiltrates or effusions. Accentuation of the interstitial markings at the bases is felt to be due to a relatively  shallow inspiration. No acute bone abnormality. Calcification in the  thoracic aorta. IMPRESSION: No acute abnormality.  Chronic mild cardiomegaly. Aortic atherosclerosis. Electronically Signed   By: Francene Boyers M.D.   On: 05-May-2016 18:25   Dg Abd Portable 1v  Result Date: 04/08/2016 CLINICAL DATA:  Enteric tube placement EXAM: PORTABLE ABDOMEN - 1 VIEW COMPARISON:  10/23/2011 CT abdomen/pelvis FINDINGS: Enteric tube is coiled in the lower thoracic esophagus with the tip oriented superiorly in the mid thoracic esophagus. No disproportionately dilated small bowel loops. No evidence of pneumatosis or pneumoperitoneum. Surgical clips in the right upper quadrant of the abdomen. Mild patchy opacities at both lung bases. IMPRESSION: Malpositioned enteric tube, which is coiled in the lower thoracic esophagus with the tip oriented superiorly in the mid thoracic esophagus. Recommend repositioning / replacement. Nonobstructive bowel gas pattern. These results will be called to the ordering clinician or representative by the Radiologist Assistant, and communication documented in the PACS or zVision Dashboard. Electronically Signed   By: Delbert Phenix M.D.   On: 04/08/2016 13:12   Cerebral Angiogram  S/P endovascular treatment of LT MCA occlusion ,with minimal revascularization. x1pass with solitaire 4 mm x 40 mm retrieval device and multiple attempts  With  mechanical thrombolysis.    PHYSICAL EXAM Elderly Caucasian lady intubated . Not in distress. . Afebrile. Head is nontraumatic. Neck is supple without bruit.    Cardiac exam no murmur or gallop. Lungs are clear to auscultation. Distal pulses are well felt. Neurological Exam :  Patient is intubated. Eyes are closed but opens to auditory and tactile stimuli. Left gaze deviation. Will not to the right. Blinks to threat on the left but not the right. Pupils are both irregular but reactive. Fundi could not be visualized. Patient is globally aphasic and will not follow even midline commands. Right lower facial weakness. Tongue  midline. Dense right hemiplegia with slight withdrawal in right leg and no withdrawal in the right upper extremity. Purposeful antigravity movements on the left side. Tone is diminished on the right compared to the left. Right plantar upgoing left downgoing. ASSESSMENT/PLAN Ms. Destiny Davis is a 82 y.o. female with history of atrial fibrillation not on anticoagulation, CHF, CAD, HTN, HLD, CAD and previous stroke presenting with R hemiparesis. She was treated with IV t-PA at [facility] May 05, 2016 at 1555 and transferred to [facility] where she received IR with minimal revascularization of LT MCA occlusion with multiple attempts.   Stroke:  Dominant left MCA infarct s/p IV tPA minimal revascularization of LT MCA occlusion with multiple attempts. Infarct  embolic secondary to known atrial fibrillation.   Resultant  R hemiparesis, expressive aphasia, VDRF  Cerebral angio L MCA occlusion  CT head post IR with evolving L MCA infarct, LM1 residual thrombus, contrast staining L BG and anterior L frontal lobe  MRI  Today at 1300  2D Echo  ordered  LDL 60  HgbA1c pending  SCDs for VTE prophylaxis Diet NPO time specified  No antithrombotic prior to admission, now on No antithrombotic  As withn 24h of tPA. plan aspirin suppository if 24h imaging neg for hemorrhage  Ongoing aggressive stroke risk factor management  Therapy recommendations:  pending   Disposition:  pending (independent but not driving or managing own finances PTA)  Respiratory Failure  Intubated for neuro intervention  CCM managing  Atrial Fibrillation  Home anticoagulation:  none . Had been on coumadin in the past - was stopped 3 years ago due to GIB  Currently, not an anticoagulation candidate  post tPA    Hypertension Stable BP goal 120-140 per Dr. Corliss Skains Long-term BP goal normotensive  Hyperlipidemia  Home meds:  None listed at this time  LDL 60, goal < 70  Continue statin at discharge if pt on statin  PTA  Diabetes  HgbA1c pending, goal < 7.0  Other Stroke Risk Factors  Advanced age  Coronary artery disease - MI  CHF  Other Active Problems Hypothyroidism Hx GIB Mild cognitive difficulties, started on namenda within past 1 year without change, it was stopped  Hospital day # 1  SETHI,PRAMOD  [facility] See Amion for Pager information 04/08/2016 1:32 PM  I have personally examined this patient, reviewed notes, independently viewed imaging studies, participated in medical decision making and plan of care. I have made any additions or clarifications directly to the above note. Agree with note above. She presented with aphasia and dense right hemiplegia due to left middle cerebral artery occlusion and was treated with IV TPA followed by unsuccessful attempt at mechanical embolectomy. She remains at risk for neurological worsening, post TPA hemorrhage, cytotoxic edema and needs close neurological and hemodynamic monitoring with respiratory support. Her prognosis remains guarded at this time. I had a long discussion with the patient's 2 daughters and son at the bedside and answered questions. Plan to check MRI scan later today and make decisions about extubation based on infarct size and presence of edema. Strict blood pressure control and  Close neurological monitoring. Discussed with Dr. Vassie Loll and Corliss Skains This patient is critically ill and at significant risk of neurological worsening, death and care requires constant monitoring of vital signs, hemodynamics,respiratory and cardiac monitoring, extensive review of multiple databases, frequent neurological assessment, discussion with family, other specialists and medical decision making of high complexity.I have made any additions or clarifications directly to the above note.This critical care time does not reflect procedure time, or teaching time or supervisory time of PA/NP/Med Resident etc but could involve care discussion  time.  I spent 60 minutes of neurocritical care time  in the care of  this patient.     Delia Heady, MD Medical Director [facility] Pager: [phone] 04/08/2016 1:33 PM    To contact Stroke Continuity provider, please refer to [email or web address]. After hours, contact General Neurology

## 2016-04-08 NOTE — Care Management Note (Signed)
Case Management Note  Patient Details  Name: Destiny Davis MRN: [id number] Date of Birth: [birth year]  Subjective/Objective:  Pt admitted on 04/26/2016 with stroke; TPA given.  PTA, pt functionally independent of ADLS.  Has supportive children.                    Action/Plan: Will follow for discharge planning as pt progresses.    Expected Discharge Date:                  Expected Discharge Plan:  IP Rehab Facility  In-House Referral:     Discharge planning Services  CM Consult  Post Acute Care Choice:    Choice offered to:     DME Arranged:    DME Agency:     HH Arranged:    HH Agency:     Status of Service:  In process, will continue to follow  If discussed at Long Length of Stay Meetings, dates discussed:    Additional Comments:  Quintella BatonJulie W. Aidee Latimore, RN, BSN  Trauma/Neuro ICU Case Manager [phone]

## 2016-04-08 NOTE — Progress Notes (Signed)
Patient transported to MRI and back to room 3M02 without complications.

## 2016-04-08 NOTE — Progress Notes (Signed)
OT Cancellation Note  Patient Details Name: Cala BradfordMargaret Arentz MRN: [id number] DOB: February 05, 1919   Cancelled Treatment:    Reason Eval/Treat Not Completed: Patient not medically ready (active bedrest orders)  Gaye AlkenBailey A Nioka Thorington M.S., OTR/L Pager: [phone]  04/08/2016, 7:31 AM

## 2016-04-08 NOTE — Anesthesia Postprocedure Evaluation (Signed)
Anesthesia Post Note  Patient: Destiny Davis  Procedure(s) Performed: Procedure(s) (LRB): RADIOLOGY WITH ANESTHESIA (N/A)  Patient location during evaluation: SICU Anesthesia Type: General Level of consciousness: sedated and patient remains intubated per anesthesia plan Pain management: pain level controlled Vital Signs Assessment: post-procedure vital signs reviewed and stable Respiratory status: patient remains intubated per anesthesia plan Cardiovascular status: stable Anesthetic complications: no    Last Vitals:  Vitals:   04/08/16 0500 04/08/16 0515  BP: 130/80 (!) 114/49  Pulse: 76 73  Resp: 16 17  Temp:      Last Pain:  Vitals:   04/08/16 0400  TempSrc: Axillary                 Lewie LoronJohn [place] Talcott

## 2016-04-08 NOTE — Progress Notes (Signed)
SLP Cancellation Note  Patient Details Name: Destiny Davis MRN: [id number] DOB: December 24, 1918   Cancelled treatment:       Reason Eval/Treat Not Completed: Medical issues which prohibited therapy (Patient remains intubated. Will f/u 8/11. )  Ferdinand LangoLeah Bertin Inabinet MA, CCC-SLP [phone]  Donyae Kilner Meryl 04/08/2016, 8:53 AM

## 2016-04-08 NOTE — Progress Notes (Signed)
Initial Nutrition Assessment  INTERVENTION:   Vital AF 1.2 @ 40 ml/hr Provides: 960 ml, 1080 kcal, 72 grams protein, and 778 ml H2O.    NUTRITION DIAGNOSIS:   Inadequate oral intake related to inability to eat as evidenced by NPO status.  GOAL:   Patient will meet greater than or equal to 90% of their needs  MONITOR:   TF tolerance, I & O's, Vent status  REASON FOR ASSESSMENT:   Consult, Ventilator Enteral/tube feeding initiation and management  ASSESSMENT:   Very independent 82 year old with hx of HTN, CHF, high cholesterol, MI who suffered R MCA stroke 8/9. Received IV TPA, then to IR with incomplete revascularization.    Patient is currently intubated on ventilator support MV: 7.6 L/min Temp (24hrs), Avg:97.8 F (36.6 C), Min:97.3 F (36.3 C), Max:98.3 F (36.8 C)  Medications reviewed.  Labs reviewed: glucose 196 Nutrition-Focused physical exam completed. Findings are no fat depletion, no muscle depletion, and no edema.   Pt discussed during ICU rounds and with RN.  OG tube Per RN xray pending for OG tube will start feedings after MRI which is scheduled for 1 pm.   Diet Order:  Diet NPO time specified  Skin:  Reviewed, no issues  Last BM:  unknown  Height:   Ht Readings from Last 1 Encounters:  04/08/16 5\' 3"  (1.6 m)    Weight:   Wt Readings from Last 1 Encounters:  04/25/2016 132 lb 0.9 oz (59.9 kg)    Ideal Body Weight:  52.2 kg  BMI:  Body mass index is 23.39 kg/m.  Estimated Nutritional Needs:   Kcal:  1100  Protein:  70-80 grams  Fluid:  > 1.5 L/day  EDUCATION NEEDS:   No education needs identified at this time  Kendell BaneHeather Paityn Balsam RD, LDN, CNSC [phone] Pager [phone] After Hours Pager

## 2016-04-08 NOTE — Progress Notes (Signed)
PT Cancellation Note  Patient Details Name: Destiny Davis MRN: [id number] DOB: June 30, 1919   Cancelled Treatment:    Reason Eval/Treat Not Completed: Patient not medically ready (active bedrest orders)   [name], [name] 04/08/2016, 7:57 AM [name], PT DPT  [phone]

## 2016-04-08 NOTE — Progress Notes (Signed)
PULMONARY / CRITICAL CARE MEDICINE   Name: Destiny Davis MRN: [id number] DOB: 11-28-1918    ADMISSION DATE:  04/13/2016 CONSULTATION DATE:  04/25/2016  REFERRING MD:  Dr. Hilda Blades (Neurology)  CHIEF COMPLAINT:  CVA  HISTORY OF PRESENT ILLNESS:   Pt had stroke symptoms that started around 2:30 on 04/20/2016. She went to [facility] where she was administered IV TPA, but continue to have symptoms so she was transferred to our facility for further management. Decision was made after a CT angiogram confirmed persistent occlusion of the left MCA for her to go to interventional radiology to attempt thromboliysis. Revascularization was attempted but it appears from the report that she still has a left MCA occlusion. She was intubated for that procedure and pulmonary critical care medicine was consulted for management of the ventilator.  SUBJECTIVE: Remains on vent.  More awake but not following commands.  Failed SBT r/t poor effort. No sedation overnight.    VITAL SIGNS: BP 132/77   Pulse 71   Temp 97.7 F (36.5 C) (Axillary)   Resp 19   SpO2 100%   HEMODYNAMICS:    VENTILATOR SETTINGS: Vent Mode: PRVC FiO2 (%):  [40 %-60 %] 40 % Set Rate:  [16 bmp] 16 bmp Vt Set:  [480 mL] 480 mL PEEP:  [5 cmH20] 5 cmH20 Plateau Pressure:  [14 cmH20-16 cmH20] 14 cmH20  INTAKE / OUTPUT: I/O last 3 completed shifts: In: 2250 [I.V.:2200; IV Piggyback:50] Out: 55 [Blood:55]  PHYSICAL EXAMINATION: General: Elderly female, resting in bed, in NAD. Neuro: opens eyes, tracks at times, does not follow commands, flaccid R, hold L arm in air  HEENT: [place]/AT. PERRL, sclerae anicteric. Cardiovascular: RRR, no M/R/G.  Lungs: Respirations even and unlabored on vent.  CTA bilaterally, No W/R/R.   Abdomen: BS x 4, soft, NT/ND.  Musculoskeletal: No gross deformities, no edema.  Skin: Intact, warm, no rashes.   LABS:  BMET  Recent Labs Lab 04/08/16 0140 04/08/16 0430  NA 138 139  K 3.2* 3.5   CL 107 106  CO2 21* 21*  BUN 11 10  CREATININE 0.94 0.91  GLUCOSE 226* 196*    Electrolytes  Recent Labs Lab 04/08/16 0140 04/08/16 0430  CALCIUM 8.1* 8.1*    CBC  Recent Labs Lab 04/08/16 0030 04/08/16 0430  WBC 11.3* 12.3*  HGB 11.5* 12.7  HCT 35.5* 38.9  PLT 178 206    Coag's No results for input(s): APTT, INR in the last 168 hours.  Sepsis Markers No results for input(s): LATICACIDVEN, PROCALCITON, O2SATVEN in the last 168 hours.  ABG  Recent Labs Lab 04/26/2016 2322  PHART 7.398  PCO2ART 35.1  PO2ART 107.0*    Liver Enzymes  Recent Labs Lab 04/08/16 0140  AST 25  ALT 14  ALKPHOS 41  BILITOT 1.1  ALBUMIN 3.2*    Cardiac Enzymes No results for input(s): TROPONINI, PROBNP in the last 168 hours.  Glucose No results for input(s): GLUCAP in the last 168 hours.  Imaging Ct Angio Head W Or Wo Contrast  Result Date: 04/04/2016 CLINICAL DATA:  82 year old female with altered mental status. Subsequent encounter. EXAM: CT ANGIOGRAPHY HEAD AND NECK TECHNIQUE: Multidetector CT imaging of the head and neck was performed using the standard protocol during bolus administration of intravenous contrast. Multiplanar CT image reconstructions and MIPs were obtained to evaluate the vascular anatomy. Carotid stenosis measurements (when applicable) are obtained utilizing NASCET criteria, using the distal internal carotid diameter as the denominator. CONTRAST:  50 cc Isovue 370. COMPARISON:  None. Per history of Dr. Hilda BladesArmstrong, [facility]. These images are currently unavailable. This is being worked on by Haematologiststaff. FINDINGS: CT HEAD Brain: Left middle cerebral artery distribution early infarct with poor collaterals as discussed below. This may end up being a large distribution infarct. Global atrophy. No intracranial hemorrhage. No intracranial mass. Calvarium and skull base: No fracture. Paranasal sinuses: Mucosal thickening  maxillary sinuses greater on left measuring up to 1 cm. Orbits: Conjugate gaze to the left. CTA NECK Aortic arch: Common origin innominate and left common carotid artery. Plaque aortic arch. Plaque with mild narrowing proximal left subclavian artery. 2 cm above its origin, kink with mild to slightly moderate narrowing left subclavian artery. Right carotid system: Moderate narrowing origin of the right common carotid artery. Plaque right carotid bifurcation with 50% maximal diameter stenosis. Left carotid system: Plaque left carotid bifurcation with less than 50% diameter stenosis. Vertebral arteries:Plaque with mild narrowing origin left vertebral artery. Ectatic proximal left vertebral artery. Left vertebral artery is the dominant vertebral artery. Right vertebral artery is congenitally small. Skeleton: No obvious fracture. Rotation C1 on C2 may be related to head positioning. Other neck: Heterogeneous large thyroid gland with substernal extension on left. No worrisome dominant mass. Upper chest: No worrisome lung apical lesion. CTA HEAD Anterior circulation: Plaque cavernous segment internal carotid artery bilaterally mild narrowing bilaterally. Fetal origin right posterior cerebral artery. Abrupt cut off of flow distal M1 segment left middle cerebral artery (thrombus) with poor collateral flow. Posterior circulation: Left vertebral artery is dominant with mild narrowing. Right vertebral artery diminutive in size, irregular and narrowed throughout its course. Ectatic basilar artery with mild narrowing. Venous sinuses: Patent. Anatomic variants: As above. Delayed phase: Delayed filling in of left middle cerebral artery branches. IMPRESSION: CT HEAD Left middle cerebral artery distribution early infarct with poor collaterals as discussed below. This may end up being a large infarct. CTA NECK Common origin innominate and left common carotid artery. Plaque aortic arch. Plaque with mild narrowing proximal left  subclavian artery. 2 cm above its origin, kink with mild to slightly moderate narrowing left subclavian artery. Moderate narrowing origin of the right common carotid artery. Plaque right carotid bifurcation with 50% maximal diameter stenosis. Plaque left carotid bifurcation with less than 50% diameter stenosis. Plaque with mild narrowing origin left vertebral artery. Ectatic proximal left vertebral artery. Left vertebral artery is the dominant vertebral artery. Right vertebral artery is congenitally small. No obvious fracture. Rotation C1 on C2 may be related to head positioning. Heterogeneous large thyroid gland with substernal extension on left. No worrisome dominant mass. CTA HEAD Abrupt cut off of flow distal M1 segment left middle cerebral artery (thrombus) with poor collateral flow. Delayed filling in of left middle cerebral artery branches. Plaque cavernous segment internal carotid artery bilaterally mild narrowing bilaterally. Fetal origin right posterior cerebral artery. Left vertebral artery is dominant with mild narrowing. Right vertebral artery diminutive in size, irregular and narrowed throughout its course. Ectatic basilar artery with mild narrowing. These results were called by telephone at the time of interpretation on 04/06/2016 at 5:47 pm to Dr. Rose FillersJAMES ARMSTRONG , who verbally acknowledged these results. Electronically Signed   By: Lacy DuverneySteven  Olson M.D.   On: 2016/07/30 18:19   Ct Head Wo Contrast  Result Date: 04/09/2016 CLINICAL DATA:  Initial evaluation for acute left MCA embolism, status post endovascular intervention with minimal revascularization. EXAM: CT HEAD WITHOUT CONTRAST TECHNIQUE: Contiguous axial images were obtained from  the base of the skull through the vertex without intravenous contrast. COMPARISON:  Prior CT and CTA from earlier the same day. FINDINGS: Increased hyperdensity within the left M1 segment, likely reflecting residual thrombus and/ or IV contrast from recent angiogram.  Subtle hyperdensity within the cortical gray matter of the anterior left frontal lobe more superiorly towards the vertex likely reflects associated contrast staining. Minimal contrast staining within the left basal ganglia as well. There is subtle hypodensity within the left insular cortex and left temporal lobe, concerning for evolving left MCA territory infarct. Probable developing ischemia within the left temporal lobe and anterior left frontal lobe. No significant mass effect at this time. No definite acute intracranial hemorrhage. Gray-white matter differentiation otherwise maintained. No other acute large vessel territory infarct. No mass lesion or midline shift. No hydrocephalus. No extra-axial fluid collection. Atrophy with chronic microvascular ischemic changes again noted. Scalp soft tissues demonstrate no acute abnormality. Globes and orbits within normal limits. Senescent ocular calcifications noted. Scattered mucosal thickening within the ethmoidal air cells, right sphenoid sinus, and maxillary sinuses. Patient is intubated. No mastoid effusion. Calvarium intact. IMPRESSION: 1. Subtle loss of gray-white matter differentiation with hypodensity within the left MCA territory as above, compatible with evolving left MCA territory infarct. 2. Hyperdensity within the left M1 segment, which may reflect residual thrombus and/or contrast from recent arteriogram. Associated contrast staining within the left basal ganglia and anterior left frontal lobe. No definite acute intracranial hemorrhage. 3. No other acute intracranial process. Electronically Signed   By: Rise Mu M.D.   On: 04-18-2016 22:37   Ct Angio Neck W Or Wo Contrast  Result Date: Apr 18, 2016 CLINICAL DATA:  82 year old female with altered mental status. Subsequent encounter. EXAM: CT ANGIOGRAPHY HEAD AND NECK TECHNIQUE: Multidetector CT imaging of the head and neck was performed using the standard protocol during bolus administration of  intravenous contrast. Multiplanar CT image reconstructions and MIPs were obtained to evaluate the vascular anatomy. Carotid stenosis measurements (when applicable) are obtained utilizing NASCET criteria, using the distal internal carotid diameter as the denominator. CONTRAST:  50 cc Isovue 370. COMPARISON:  None. Per history of Dr. Hilda Blades, patient has [facility] CT scan read as a code stroke. These images are currently unavailable. This is being worked on by Haematologist. FINDINGS: CT HEAD Brain: Left middle cerebral artery distribution early infarct with poor collaterals as discussed below. This may end up being a large distribution infarct. Global atrophy. No intracranial hemorrhage. No intracranial mass. Calvarium and skull base: No fracture. Paranasal sinuses: Mucosal thickening maxillary sinuses greater on left measuring up to 1 cm. Orbits: Conjugate gaze to the left. CTA NECK Aortic arch: Common origin innominate and left common carotid artery. Plaque aortic arch. Plaque with mild narrowing proximal left subclavian artery. 2 cm above its origin, kink with mild to slightly moderate narrowing left subclavian artery. Right carotid system: Moderate narrowing origin of the right common carotid artery. Plaque right carotid bifurcation with 50% maximal diameter stenosis. Left carotid system: Plaque left carotid bifurcation with less than 50% diameter stenosis. Vertebral arteries:Plaque with mild narrowing origin left vertebral artery. Ectatic proximal left vertebral artery. Left vertebral artery is the dominant vertebral artery. Right vertebral artery is congenitally small. Skeleton: No obvious fracture. Rotation C1 on C2 may be related to head positioning. Other neck: Heterogeneous large thyroid gland with substernal extension on left. No worrisome dominant mass. Upper chest: No worrisome lung apical lesion. CTA HEAD Anterior circulation: Plaque cavernous segment internal carotid artery bilaterally mild narrowing  bilaterally. Fetal origin right posterior cerebral artery. Abrupt cut off of flow distal M1 segment left middle cerebral artery (thrombus) with poor collateral flow. Posterior circulation: Left vertebral artery is dominant with mild narrowing. Right vertebral artery diminutive in size, irregular and narrowed throughout its course. Ectatic basilar artery with mild narrowing. Venous sinuses: Patent. Anatomic variants: As above. Delayed phase: Delayed filling in of left middle cerebral artery branches. IMPRESSION: CT HEAD Left middle cerebral artery distribution early infarct with poor collaterals as discussed below. This may end up being a large infarct. CTA NECK Common origin innominate and left common carotid artery. Plaque aortic arch. Plaque with mild narrowing proximal left subclavian artery. 2 cm above its origin, kink with mild to slightly moderate narrowing left subclavian artery. Moderate narrowing origin of the right common carotid artery. Plaque right carotid bifurcation with 50% maximal diameter stenosis. Plaque left carotid bifurcation with less than 50% diameter stenosis. Plaque with mild narrowing origin left vertebral artery. Ectatic proximal left vertebral artery. Left vertebral artery is the dominant vertebral artery. Right vertebral artery is congenitally small. No obvious fracture. Rotation C1 on C2 may be related to head positioning. Heterogeneous large thyroid gland with substernal extension on left. No worrisome dominant mass. CTA HEAD Abrupt cut off of flow distal M1 segment left middle cerebral artery (thrombus) with poor collateral flow. Delayed filling in of left middle cerebral artery branches. Plaque cavernous segment internal carotid artery bilaterally mild narrowing bilaterally. Fetal origin right posterior cerebral artery. Left vertebral artery is dominant with mild narrowing. Right vertebral artery diminutive in size, irregular and narrowed throughout its course. Ectatic basilar artery  with mild narrowing. These results were called by telephone at the time of interpretation on Apr 27, 2016 at 5:47 pm to Dr. Rose Fillers , who verbally acknowledged these results. Electronically Signed   By: Lacy Duverney M.D.   On: 27-Apr-2016 18:19   Dg Chest Portable 1 View  Result Date: Apr 27, 2016 CLINICAL DATA:  Stroke.  Possible aspiration. EXAM: PORTABLE CHEST 1 VIEW COMPARISON:  Chest CT dated 07/23/2015 and chest x-ray dated 07/09/2015 FINDINGS: Chronic mild cardiomegaly. Pulmonary vascularity is normal. No infiltrates or effusions. Accentuation of the interstitial markings at the bases is felt to be due to a relatively shallow inspiration. No acute bone abnormality. Calcification in the thoracic aorta. IMPRESSION: No acute abnormality.  Chronic mild cardiomegaly. Aortic atherosclerosis. Electronically Signed   By: Francene Boyers M.D.   On: 04/27/16 18:25     STUDIES:  CT head 8/9 > L MCA infarct  CULTURES: None.  ANTIBIOTICS: None.  SIGNIFICANT EVENTS: 8/9 - CVA, IV TPA, to IR. To ICU for recovery  LINES/TUBES: ETT 8/9 >  DISCUSSION: Very independent 82 year old suffered R MCA stroke 8/9. Received IV TPA, then to IR. Out to ICU on vent.    ASSESSMENT / PLAN:  NEUROLOGIC A:   Acute encephalopathy. L MCA infarct - s/p tPA and partial revascularization / mechanical thrombolysis attempts per IR 8/9. P:   Limit sedation as able - PRN fentanyl  RASS goal: 0 to -1. Daily WUA. Stroke workup per neuro. Hold outpatient sertraline.   PULMONARY A: Inability to protect airway in the post-operative setting.  P:   Full vent support.  Daily SBT - failed 8/10 r/t poor effort, Vt 150  F/u CXR   CARDIOVASCULAR A:  H/o A.fib, HTN, MI, CAD. P:  SBP goal 120-140 per Dr. Corliss Skains. Off cardene - resume if needed for BP control  Continue outpatient atorvastatin. Hold outpatient  furosemide, nitro, lisinopril.  RENAL A:   No acute issues. P:   BMP tonight and in  AM.  GASTROINTESTINAL A:   GI prophylaxis. Nutrition. P:   SUP: famotidine. Begin TF 8/10  HEMATOLOGIC A:   TPA administered 1530. VTE prophylaxis. P: SCD's. CBC now and in AM.  INFECTIOUS A:   No indication of infection. P:   Monitor clinically.  ENDOCRINE A:   Hypothyroidism - TSH 5.3 P:   Continue outpatient synthroid - change to Per tube 150mg /day    FAMILY  Updates: 2 daughters updated at bedside 63/10  Inter-disciplinary family meet or Palliative Care meeting due by:  8/16.   Dirk Dress, NP 04/08/2016  9:25 AM Pager: [phone] or [phone]

## 2016-04-09 ENCOUNTER — Inpatient Hospital Stay (HOSPITAL_COMMUNITY): Payer: Medicare Other

## 2016-04-09 DIAGNOSIS — I63512 Cerebral infarction due to unspecified occlusion or stenosis of left middle cerebral artery: Secondary | ICD-10-CM

## 2016-04-09 DIAGNOSIS — I6789 Other cerebrovascular disease: Secondary | ICD-10-CM

## 2016-04-09 LAB — MAGNESIUM
Magnesium: 1.9 mg/dL (ref 1.7–2.4)
Magnesium: 2 mg/dL (ref 1.7–2.4)

## 2016-04-09 LAB — BASIC METABOLIC PANEL
Anion gap: 6 (ref 5–15)
BUN: 13 mg/dL (ref 6–20)
CO2: 25 mmol/L (ref 22–32)
CREATININE: 0.9 mg/dL (ref 0.44–1.00)
Calcium: 8.2 mg/dL — ABNORMAL LOW (ref 8.9–10.3)
Chloride: 110 mmol/L (ref 101–111)
GFR calc Af Amer: >60 mL/min (ref >60)
GFR, EST NON AFRICAN AMERICAN: 53 mL/min — AB (ref >60)
GLUCOSE: 124 mg/dL — AB (ref 65–99)
Potassium: 4.3 mmol/L (ref 3.5–5.1)
SODIUM: 141 mmol/L (ref 135–145)

## 2016-04-09 LAB — CBC
HCT: 35.3 % — ABNORMAL LOW (ref 36.0–46.0)
Hemoglobin: 11.3 g/dL — ABNORMAL LOW (ref 12.0–15.0)
MCH: 29.7 pg (ref 26.0–34.0)
MCHC: 32 g/dL (ref 30.0–36.0)
MCV: 92.7 fL (ref 78.0–100.0)
PLATELETS: 183 10*3/uL (ref 150–400)
RBC: 3.81 MIL/uL — ABNORMAL LOW (ref 3.87–5.11)
RDW: 14.5 % (ref 11.5–15.5)
WBC: 10.8 10*3/uL — AB (ref 4.0–10.5)

## 2016-04-09 LAB — GLUCOSE, CAPILLARY
GLUCOSE-CAPILLARY: 128 mg/dL — AB (ref 65–99)
GLUCOSE-CAPILLARY: 128 mg/dL — AB (ref 65–99)
GLUCOSE-CAPILLARY: 133 mg/dL — AB (ref 65–99)
Glucose-Capillary: 115 mg/dL — ABNORMAL HIGH (ref 65–99)
Glucose-Capillary: 132 mg/dL — ABNORMAL HIGH (ref 65–99)
Glucose-Capillary: 145 mg/dL — ABNORMAL HIGH (ref 65–99)

## 2016-04-09 LAB — ECHOCARDIOGRAM COMPLETE
Height: 63 in
Weight: 2169.33 oz

## 2016-04-09 LAB — HEMOGLOBIN A1C
HEMOGLOBIN A1C: 5.3 % (ref 4.8–5.6)
Mean Plasma Glucose: 105 mg/dL

## 2016-04-09 LAB — PHOSPHORUS
PHOSPHORUS: 1.5 mg/dL — AB (ref 2.5–4.6)
PHOSPHORUS: 1.6 mg/dL — AB (ref 2.5–4.6)

## 2016-04-09 MED ORDER — ASPIRIN 325 MG PO TABS
325.0000 mg | ORAL_TABLET | Freq: Every day | ORAL | Status: DC
Start: 2016-04-09 — End: 2016-04-10
  Administered 2016-04-09 – 2016-04-10 (×2): 325 mg via ORAL
  Filled 2016-04-09 (×2): qty 1

## 2016-04-09 NOTE — Progress Notes (Signed)
STROKE TEAM PROGRESS NOTE   HISTORY OF PRESENT ILLNESS (per record) Destiny Davis is an 82 y.o. female who presents as a transfer from [place]. It is reported that at 2:30p this afternoon 04-28-2016 she fell and was experiencing a facial and right hemiparesis (LKW). She arrived in the emergency room within the time window for TPA. The telemetry neurologist recommended administration of TPA. After initiating tPA therapy the ER physician requested transfer of the patient to [facility] emergency room for further evaluation and treatment.  Upon arrival the patient was taken to CTA for further evaluation. 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 This was discussed with the family. Destiny Davis's baseline status was discussed in length. The family reports that Destiny Davis lived independently and was able to take care of herself in all aspects. He report she was able to prepare her own meals bathing herself clothing herself and otherwise was functionally independent. We discussed the fact that she had been previously seen by neurologist and was noted to have some issues with her cognitive function. It was also noted that at some point within the past year she was started on Namenda. The family reports that they felt her symptoms were mild and that the Namenda provided no benefit. It was discontinued by the family. The family reports that Destiny Davis does not drive or maintain her own finances. However they report she was not actually having any difficulty driving there was simply concerned about her vision and her age. They assisted with her finances as they felt they were somewhat complex. However they report that she would still write her own checks.  The possibility of interventional radiology was discussed with the family at length. All 3 children felt that they would like for [place] to proceed with interventional therapy. The case was discussed with Dr. Shan Levans. Dr. Shan Levans came to the hospital  and discussed the risk and benefits of interventional neuroradiology with the family. After thorough discussion of the risk and benefits of the procedure the family requested additional intervention. The patient was subsequently taken to the interventional radiology suite .S/P endovascular treatment of LT MCA occlusion ,with minimal revascularization. x1pass with solitaire 4 mm x 40 mm retrieval device and multiple attempts  With  mechanical thrombolysis She was admitted to the neuro ICU for further evaluation and treatment.   SUBJECTIVE (INTERVAL HISTORY) Her daughters (x2) and 1 grandson are present (there is another son on the way) at the bedside.  IR tech removing sheath. RNs also at bedside. Patient is intubated, awake, aphasic with R hemiparesis. MRI scan shows a large left middle cerebral artery infarct with cytotoxic edema. And discuss MRI findings with patient's daughter and grandson at the bedside.   OBJECTIVE Temp:  [98 F (36.7 C)-99.4 F (37.4 C)] 99.4 F (37.4 C) (08/11 0800) Pulse Rate:  [51-106] 69 (08/11 0900) Cardiac Rhythm: Normal sinus rhythm (08/11 0800) Resp:  [0-20] 17 (08/11 0900) BP: (75-160)/(44-70) 150/64 (08/11 0900) SpO2:  [98 %-100 %] 99 % (08/11 0900) FiO2 (%):  [40 %] 40 % (08/11 0714) Weight:  [61.5 kg (135 lb 9.3 oz)] 61.5 kg (135 lb 9.3 oz) (08/11 0500)  CBC:   Recent Labs Lab 04/08/16 0430 04/09/16 0519  WBC 12.3* 10.8*  NEUTROABS 11.4*  --   HGB 12.7 11.3*  HCT 38.9 35.3*  MCV 92.2 92.7  PLT 206 183    Basic Metabolic Panel:   Recent Labs Lab 04/08/16 0430  04/08/16 1600 04/09/16 0519  NA 139  --   --  141  K 3.5  --   --  4.3  CL 106  --   --  110  CO2 21*  --   --  25  GLUCOSE 196*  --   --  124*  BUN 10  --   --  13  CREATININE 0.91  --   --  0.90  CALCIUM 8.1*  --   --  8.2*  MG  --   < > 1.8 1.9  PHOS  --   < > 2.7 1.5*  < > = values in this interval not displayed.  Lipid Panel:     Component Value Date/Time   CHOL  117 04/08/2016 0430   TRIG 58 04/08/2016 0430   HDL 45 04/08/2016 0430   CHOLHDL 2.6 04/08/2016 0430   VLDL 12 04/08/2016 0430   LDLCALC 60 04/08/2016 0430   HgbA1c:  Lab Results  Component Value Date   HGBA1C 5.3 04/08/2016   Urine Drug Screen: No results found for: LABOPIA, COCAINSCRNUR, LABBENZ, AMPHETMU, THCU, LABBARB    IMAGING  Ct Angio Head W Or Wo Contrast  Result Date: April 24, 2016 CLINICAL DATA:  82 year old female with altered mental status. Subsequent encounter. EXAM: CT ANGIOGRAPHY HEAD AND NECK TECHNIQUE: Multidetector CT imaging of the head and neck was performed using the standard protocol during bolus administration of intravenous contrast. Multiplanar CT image reconstructions and MIPs were obtained to evaluate the vascular anatomy. Carotid stenosis measurements (when applicable) are obtained utilizing NASCET criteria, using the distal internal carotid diameter as the denominator. CONTRAST:  50 cc Isovue 370. COMPARISON:  None. Per history of Dr. Hilda Blades, patient has [facility] CT scan read as a code stroke. These images are currently unavailable. This is being worked on by Haematologist. FINDINGS: CT HEAD Brain: Left middle cerebral artery distribution early infarct with poor collaterals as discussed below. This may end up being a large distribution infarct. Global atrophy. No intracranial hemorrhage. No intracranial mass. Calvarium and skull base: No fracture. Paranasal sinuses: Mucosal thickening maxillary sinuses greater on left measuring up to 1 cm. Orbits: Conjugate gaze to the left. CTA NECK Aortic arch: Common origin innominate and left common carotid artery. Plaque aortic arch. Plaque with mild narrowing proximal left subclavian artery. 2 cm above its origin, kink with mild to slightly moderate narrowing left subclavian artery. Right carotid system: Moderate narrowing origin of the right common carotid artery. Plaque right carotid bifurcation with 50% maximal diameter  stenosis. Left carotid system: Plaque left carotid bifurcation with less than 50% diameter stenosis. Vertebral arteries:Plaque with mild narrowing origin left vertebral artery. Ectatic proximal left vertebral artery. Left vertebral artery is the dominant vertebral artery. Right vertebral artery is congenitally small. Skeleton: No obvious fracture. Rotation C1 on C2 may be related to head positioning. Other neck: Heterogeneous large thyroid gland with substernal extension on left. No worrisome dominant mass. Upper chest: No worrisome lung apical lesion. CTA HEAD Anterior circulation: Plaque cavernous segment internal carotid artery bilaterally mild narrowing bilaterally. Fetal origin right posterior cerebral artery. Abrupt cut off of flow distal M1 segment left middle cerebral artery (thrombus) with poor collateral flow. Posterior circulation: Left vertebral artery is dominant with mild narrowing. Right vertebral artery diminutive in size, irregular and narrowed throughout its course. Ectatic basilar artery with mild narrowing. Venous sinuses: Patent. Anatomic variants: As above. Delayed phase: Delayed filling in of left middle cerebral artery branches. IMPRESSION: CT HEAD Left middle cerebral artery distribution early infarct with poor collaterals as discussed below. This may end  up being a large infarct. CTA NECK Common origin innominate and left common carotid artery. Plaque aortic arch. Plaque with mild narrowing proximal left subclavian artery. 2 cm above its origin, kink with mild to slightly moderate narrowing left subclavian artery. Moderate narrowing origin of the right common carotid artery. Plaque right carotid bifurcation with 50% maximal diameter stenosis. Plaque left carotid bifurcation with less than 50% diameter stenosis. Plaque with mild narrowing origin left vertebral artery. Ectatic proximal left vertebral artery. Left vertebral artery is the dominant vertebral artery. Right vertebral artery is  congenitally small. No obvious fracture. Rotation C1 on C2 may be related to head positioning. Heterogeneous large thyroid gland with substernal extension on left. No worrisome dominant mass. CTA HEAD Abrupt cut off of flow distal M1 segment left middle cerebral artery (thrombus) with poor collateral flow. Delayed filling in of left middle cerebral artery branches. Plaque cavernous segment internal carotid artery bilaterally mild narrowing bilaterally. Fetal origin right posterior cerebral artery. Left vertebral artery is dominant with mild narrowing. Right vertebral artery diminutive in size, irregular and narrowed throughout its course. Ectatic basilar artery with mild narrowing. These results were called by telephone at the time of interpretation on 2016-04-28 at 5:47 pm to Dr. Rose Fillers , who verbally acknowledged these results. Electronically Signed   By: Lacy Duverney M.D.   On: 04/28/2016 18:19   Ct Head Wo Contrast  Result Date: 2016/04/28 CLINICAL DATA:  Initial evaluation for acute left MCA embolism, status post endovascular intervention with minimal revascularization. EXAM: CT HEAD WITHOUT CONTRAST TECHNIQUE: Contiguous axial images were obtained from the base of the skull through the vertex without intravenous contrast. COMPARISON:  Prior CT and CTA from earlier the same day. FINDINGS: Increased hyperdensity within the left M1 segment, likely reflecting residual thrombus and/ or IV contrast from recent angiogram. Subtle hyperdensity within the cortical gray matter of the anterior left frontal lobe more superiorly towards the vertex likely reflects associated contrast staining. Minimal contrast staining within the left basal ganglia as well. There is subtle hypodensity within the left insular cortex and left temporal lobe, concerning for evolving left MCA territory infarct. Probable developing ischemia within the left temporal lobe and anterior left frontal lobe. No significant mass effect at this  time. No definite acute intracranial hemorrhage. Gray-white matter differentiation otherwise maintained. No other acute large vessel territory infarct. No mass lesion or midline shift. No hydrocephalus. No extra-axial fluid collection. Atrophy with chronic microvascular ischemic changes again noted. Scalp soft tissues demonstrate no acute abnormality. Globes and orbits within normal limits. Senescent ocular calcifications noted. Scattered mucosal thickening within the ethmoidal air cells, right sphenoid sinus, and maxillary sinuses. Patient is intubated. No mastoid effusion. Calvarium intact. IMPRESSION: 1. Subtle loss of gray-white matter differentiation with hypodensity within the left MCA territory as above, compatible with evolving left MCA territory infarct. 2. Hyperdensity within the left M1 segment, which may reflect residual thrombus and/or contrast from recent arteriogram. Associated contrast staining within the left basal ganglia and anterior left frontal lobe. No definite acute intracranial hemorrhage. 3. No other acute intracranial process. Electronically Signed   By: Rise Mu M.D.   On: April 28, 2016 22:37   Ct Angio Neck W Or Wo Contrast  Result Date: April 28, 2016 CLINICAL DATA:  82 year old female with altered mental status. Subsequent encounter. EXAM: CT ANGIOGRAPHY HEAD AND NECK TECHNIQUE: Multidetector CT imaging of the head and neck was performed using the standard protocol during bolus administration of intravenous contrast. Multiplanar CT image reconstructions and MIPs were  obtained to evaluate the vascular anatomy. Carotid stenosis measurements (when applicable) are obtained utilizing NASCET criteria, using the distal internal carotid diameter as the denominator. CONTRAST:  50 cc Isovue 370. COMPARISON:  None. Per history of Dr. Hilda Blades, patient has [facility] CT scan read as a code stroke. These images are currently unavailable. This is being worked on by Haematologist. FINDINGS: CT  HEAD Brain: Left middle cerebral artery distribution early infarct with poor collaterals as discussed below. This may end up being a large distribution infarct. Global atrophy. No intracranial hemorrhage. No intracranial mass. Calvarium and skull base: No fracture. Paranasal sinuses: Mucosal thickening maxillary sinuses greater on left measuring up to 1 cm. Orbits: Conjugate gaze to the left. CTA NECK Aortic arch: Common origin innominate and left common carotid artery. Plaque aortic arch. Plaque with mild narrowing proximal left subclavian artery. 2 cm above its origin, kink with mild to slightly moderate narrowing left subclavian artery. Right carotid system: Moderate narrowing origin of the right common carotid artery. Plaque right carotid bifurcation with 50% maximal diameter stenosis. Left carotid system: Plaque left carotid bifurcation with less than 50% diameter stenosis. Vertebral arteries:Plaque with mild narrowing origin left vertebral artery. Ectatic proximal left vertebral artery. Left vertebral artery is the dominant vertebral artery. Right vertebral artery is congenitally small. Skeleton: No obvious fracture. Rotation C1 on C2 may be related to head positioning. Other neck: Heterogeneous large thyroid gland with substernal extension on left. No worrisome dominant mass. Upper chest: No worrisome lung apical lesion. CTA HEAD Anterior circulation: Plaque cavernous segment internal carotid artery bilaterally mild narrowing bilaterally. Fetal origin right posterior cerebral artery. Abrupt cut off of flow distal M1 segment left middle cerebral artery (thrombus) with poor collateral flow. Posterior circulation: Left vertebral artery is dominant with mild narrowing. Right vertebral artery diminutive in size, irregular and narrowed throughout its course. Ectatic basilar artery with mild narrowing. Venous sinuses: Patent. Anatomic variants: As above. Delayed phase: Delayed filling in of left middle cerebral  artery branches. IMPRESSION: CT HEAD Left middle cerebral artery distribution early infarct with poor collaterals as discussed below. This may end up being a large infarct. CTA NECK Common origin innominate and left common carotid artery. Plaque aortic arch. Plaque with mild narrowing proximal left subclavian artery. 2 cm above its origin, kink with mild to slightly moderate narrowing left subclavian artery. Moderate narrowing origin of the right common carotid artery. Plaque right carotid bifurcation with 50% maximal diameter stenosis. Plaque left carotid bifurcation with less than 50% diameter stenosis. Plaque with mild narrowing origin left vertebral artery. Ectatic proximal left vertebral artery. Left vertebral artery is the dominant vertebral artery. Right vertebral artery is congenitally small. No obvious fracture. Rotation C1 on C2 may be related to head positioning. Heterogeneous large thyroid gland with substernal extension on left. No worrisome dominant mass. CTA HEAD Abrupt cut off of flow distal M1 segment left middle cerebral artery (thrombus) with poor collateral flow. Delayed filling in of left middle cerebral artery branches. Plaque cavernous segment internal carotid artery bilaterally mild narrowing bilaterally. Fetal origin right posterior cerebral artery. Left vertebral artery is dominant with mild narrowing. Right vertebral artery diminutive in size, irregular and narrowed throughout its course. Ectatic basilar artery with mild narrowing. These results were called by telephone at the time of interpretation on 04/03/2016 at 5:47 pm to Dr. Rose Fillers , who verbally acknowledged these results. Electronically Signed   By: Lacy Duverney M.D.   On: 04/21/2016 18:19   Mr Brain Wo Contrast  Result Date: 04/08/2016 CLINICAL DATA:  24 hour follow-up.  Status post tPA and embolectomy. EXAM: MRI HEAD WITHOUT CONTRAST TECHNIQUE: Multiplanar, multiecho pulse sequences of the brain and surrounding  structures were obtained without intravenous contrast. COMPARISON:  Head CT from yesterday.  Brain MRI 07/02/2015 FINDINGS: Calvarium and upper cervical spine: No focal marrow signal abnormality. Orbits: Gaze deviated to the left. Sinuses and Mastoids: Nasopharyngeal fluid level in the setting of intubation. Brain: Restricted diffusion throughout nearly the entire left MCA territory, with patchy sparing at the posterior border zone and sparing of the left putamen and caudate head. There is associated cytotoxic edema with mild local mass effect. 3 or 4 punctate acute infarcts in the high right posterior frontal cortex. Left M1 thrombus is re- demonstrated. No acute hemorrhage, hydrocephalus, or midline shift. Chronic small vessel ischemic changes seen in the cerebral white matter. IMPRESSION: 1. Acute infarct involving the large majority of the left MCA territory. Recurrent/persistent thrombosis of the left M1 segment. No hemorrhagic conversion. 2. Few punctate acute infarcts in the high right frontal cortex. Electronically Signed   By: Marnee Spring M.D.   On: 04/08/2016 14:39   Dg Chest Portable 1 View  Result Date: 04/09/2016 CLINICAL DATA:  Intubation. EXAM: PORTABLE CHEST 1 VIEW COMPARISON:  04/25/2016. FINDINGS: Endotracheal tube noted with tip 2.4 cm above the carina. NG tube noted with tip below left hemidiaphragm. Cardiomegaly with mild pulmonary vascular prominence. Mild bibasilar atelectasis and/or infiltrates. Small left pleural effusion cannot be excluded. No pneumothorax. IMPRESSION: 1. Endotracheal tube tip 2.4 cm above the carina. NG tube noted with tip below left hemidiaphragm. 2.  Cardiomegaly with mild pulmonary vascular prominence. 3. Mild bibasilar atelectasis and/or infiltrates . Small bilateral pleural effusions. Electronically Signed   By: Maisie Fus  Register   On: 04/09/2016 07:29   Dg Chest Portable 1 View  Result Date: 04/24/2016 CLINICAL DATA:  Stroke.  Possible aspiration. EXAM:  PORTABLE CHEST 1 VIEW COMPARISON:  Chest CT dated 07/23/2015 and chest x-ray dated 07/09/2015 FINDINGS: Chronic mild cardiomegaly. Pulmonary vascularity is normal. No infiltrates or effusions. Accentuation of the interstitial markings at the bases is felt to be due to a relatively shallow inspiration. No acute bone abnormality. Calcification in the thoracic aorta. IMPRESSION: No acute abnormality.  Chronic mild cardiomegaly. Aortic atherosclerosis. Electronically Signed   By: Francene Boyers M.D.   On: 10-Apr-2016 18:25   Dg Abd Portable 1v  Result Date: 04/08/2016 CLINICAL DATA:  Nasogastric tube placement. EXAM: PORTABLE ABDOMEN - 1 VIEW COMPARISON:  04/08/2016 and CT of the chest 07/23/2015 FINDINGS: A nasogastric tube has been repositioned, tip overlying the level of the mid stomach. Bowel gas pattern is nonobstructive. There is bibasilar atelectasis. Surgical clips are noted in the right upper quadrant of the abdomen. IMPRESSION: Repositioned nasogastric tube, tip overlying the level of the mid stomach Electronically Signed   By: Norva Pavlov M.D.   On: 04/08/2016 16:33   Dg Abd Portable 1v  Result Date: 04/08/2016 CLINICAL DATA:  Enteric tube placement EXAM: PORTABLE ABDOMEN - 1 VIEW COMPARISON:  10/23/2011 CT abdomen/pelvis FINDINGS: Enteric tube is coiled in the lower thoracic esophagus with the tip oriented superiorly in the mid thoracic esophagus. No disproportionately dilated small bowel loops. No evidence of pneumatosis or pneumoperitoneum. Surgical clips in the right upper quadrant of the abdomen. Mild patchy opacities at both lung bases. IMPRESSION: Malpositioned enteric tube, which is coiled in the lower thoracic esophagus with the tip oriented superiorly in the mid thoracic esophagus. Recommend repositioning /  replacement. Nonobstructive bowel gas pattern. These results will be called to the ordering clinician or representative by the Radiologist Assistant, and communication documented in  the PACS or zVision Dashboard. Electronically Signed   By: Delbert Phenix M.D.   On: 04/08/2016 13:12   Cerebral Angiogram  S/P endovascular treatment of LT MCA occlusion ,with minimal revascularization. x1pass with solitaire 4 mm x 40 mm retrieval device and multiple attempts  With  mechanical thrombolysis.    PHYSICAL EXAM Elderly Caucasian lady intubated . Not in distress. . Afebrile. Head is nontraumatic. Neck is supple without bruit.    Cardiac exam no murmur or gallop. Lungs are clear to auscultation. Distal pulses are well felt. Neurological Exam :  Patient is intubated. Eyes are closed but opens to auditory and tactile stimuli. Left gaze deviation. Will not to the right. Blinks to threat on the left but not the right. Pupils are both irregular but reactive. Fundi could not be visualized. Patient is globally aphasic and will not follow even midline commands. Right lower facial weakness. Tongue midline. Dense right hemiplegia with slight withdrawal in right leg and no withdrawal in the right upper extremity. Purposeful antigravity movements on the left side. Tone is diminished on the right compared to the left. Right plantar upgoing left downgoing. ASSESSMENT/PLAN Destiny Davis is a 82 y.o. female with history of atrial fibrillation not on anticoagulation, CHF, CAD, HTN, HLD, CAD and previous stroke presenting with R hemiparesis. She was treated with IV t-PA at [facility] 2016-04-23 at 1555 and transferred to [facility] where she received IR with minimal revascularization of LT MCA occlusion with multiple attempts.   Stroke:  Dominant left MCA infarct s/p IV tPA minimal revascularization of LT MCA occlusion with multiple attempts. Infarct  embolic secondary to known atrial fibrillation.   Resultant  R hemiparesis, expressive aphasia, VDRF  Cerebral angio L MCA occlusion  CT head post IR with evolving L MCA infarct, LM1 residual thrombus, contrast staining L BG and anterior L frontal lobe  MRI   Today at 1300  2D Echo  ordered  LDL 60  HgbA1c pending  SCDs for VTE prophylaxis Diet NPO time specified  No antithrombotic prior to admission, now on No antithrombotic  As withn 24h of tPA. plan aspirin suppository if 24h imaging neg for hemorrhage  Ongoing aggressive stroke risk factor management  Therapy recommendations:  pending   Disposition:  pending (independent but not driving or managing own finances PTA)  Respiratory Failure  Intubated for neuro intervention  CCM managing  Atrial Fibrillation  Home anticoagulation:  none . Had been on coumadin in the past - was stopped 3 years ago due to GIB  Currently, not an anticoagulation candidate post tPA    Hypertension Stable BP goal 120-140 per Dr. Corliss Skains Long-term BP goal normotensive  Hyperlipidemia  Home meds:  None listed at this time  LDL 60, goal < 70  Continue statin at discharge if pt on statin PTA  Diabetes  HgbA1c pending, goal < 7.0  Other Stroke Risk Factors  Advanced age  Coronary artery disease - MI  CHF  Other Active Problems Hypothyroidism Hx GIB Mild cognitive difficulties, started on namenda within past 1 year without change, it was stopped  Hospital day # 2  [facility]oder[facility] See Amion for Pager information 04/09/2016 10:17 AM  I have personally examined this patient, reviewed notes, independently viewed imaging studies, participated in medical decision making and plan of care. I have made any  additions or clarifications directly to the above note. Agree with note above. She presented with aphasia and dense right hemiplegia due to left middle cerebral artery occlusion and was treated with IV TPA followed by unsuccessful attempt at mechanical embolectomy. She remains at risk for neurological worsening, post TPA hemorrhage, cytotoxic edema and needs close neurological and hemodynamic monitoring with respiratory support. Her prognosis remains guarded at this  time. I had a long discussion with the patient's 2 daughters   at the bedside and answered questions. Plan to check MRI scan later today and make decisions about extubation based on infarct size and presence of edema. Strict blood pressure control and  Close neurological monitoring. Family is thinking about DO NOT RESUSCITATE status prior to extubation and feeding tube and nursing home placement versus comfort care. We will make a decision on the next few days prior to extubation Discussed with Dr. Vassie LollAlva  This patient is critically ill and at significant risk of neurological worsening, death and care requires constant monitoring of vital signs, hemodynamics,respiratory and cardiac monitoring, extensive review of multiple databases, frequent neurological assessment, discussion with family, other specialists and medical decision making of high complexity.I have made any additions or clarifications directly to the above note.This critical care time does not reflect procedure time, or teaching time or supervisory time of PA/NP/Med Resident etc but could involve care discussion time.  I spent 50 minutes of neurocritical care time  in the care of  this patient.     Delia HeadyPramod Itsel Opfer, MD Medical Director [facility] Pager: [phone] 04/09/2016 10:17 AM    To contact Stroke Continuity provider, please refer to [email or web address]. After hours, contact General Neurology

## 2016-04-09 NOTE — Progress Notes (Signed)
PULMONARY / CRITICAL CARE MEDICINE   Name: Cala BradfordMargaret Buscemi MRN: [id number] DOB: [birth year]    ADMISSION DATE:  04/17/2016 CONSULTATION DATE:  04/04/2016  REFERRING MD:  Dr. Hilda BladesArmstrong (Neurology)  CHIEF COMPLAINT:  CVA  HISTORY OF PRESENT ILLNESS:   Pt had stroke symptoms that started around 2:30 on Feb 24, 2016. [facility], but continue to have symptoms so she was transferred to our facility for further management. Decision was made after a CT angiogram confirmed persistent occlusion of the left MCA for her to go to interventional radiology to attempt thromboliysis. Revascularization was attempted but it appears from the report that she still has a left MCA occlusion. She was intubated for that procedure and pulmonary critical care medicine was consulted for management of the ventilator.  SUBJECTIVE: Remains on vent.  More awake, purposeful with left side, but still  not following commands.  Weaning on CPAP 40%  5/5. Sedation x 1 dose  overnight.    VITAL SIGNS: BP (!) 141/57 (BP Location: Right Arm)   Pulse 74   Temp 99.4 F (37.4 C) (Axillary)   Resp 17   Ht 5\' 3"  (1.6 m) Comment: per RN  Wt 135 lb 9.3 oz (61.5 kg)   SpO2 98%   BMI 24.02 kg/m   HEMODYNAMICS:    VENTILATOR SETTINGS: Vent Mode: PSV;CPAP FiO2 (%):  [40 %] 40 % Set Rate:  [16 bmp] 16 bmp Vt Set:  [480 mL] 480 mL PEEP:  [5 cmH20] 5 cmH20 Pressure Support:  [5 cmH20] 5 cmH20 Plateau Pressure:  [11 cmH20-16 cmH20] 16 cmH20  INTAKE / OUTPUT: I/O last 3 completed shifts: In: 4181.5 [I.V.:3465.5; NG/GT:566; IV Piggyback:150] Out: 55 [Blood:55]  PHYSICAL EXAMINATION: General: Elderly female, resting in bed, in NAD. Neuro: opens eyes, tracks at times, does not follow commands, flaccid R, hold L arm in air  HEENT: [place]/AT. PERRL, sclerae anicteric. Cardiovascular: RRR, no M/R/G.  Lungs: Respirations even and unlabored on CPAP/ PS weaning   CTA bilaterally, No  W/R/R.   Abdomen: BS x 4, soft, NT/ND.  Musculoskeletal: No gross deformities, no edema.  Skin: Intact, warm, no rashes.   LABS:  BMET  Recent Labs Lab 04/08/16 0140 04/08/16 0430 04/09/16 0519  NA 138 139 141  K 3.2* 3.5 4.3  CL 107 106 110  CO2 21* 21* 25  BUN 11 10 13   CREATININE 0.94 0.91 0.90  GLUCOSE 226* 196* 124*    Electrolytes  Recent Labs Lab 04/08/16 0140 04/08/16 0430 04/08/16 1016 04/08/16 1600 04/09/16 0519  CALCIUM 8.1* 8.1*  --   --  8.2*  MG  --   --  1.8 1.8 1.9  PHOS  --   --  3.0 2.7 1.5*    CBC  Recent Labs Lab 04/08/16 0030 04/08/16 0430 04/09/16 0519  WBC 11.3* 12.3* 10.8*  HGB 11.5* 12.7 11.3*  HCT 35.5* 38.9 35.3*  PLT 178 206 183    Coag's No results for input(s): APTT, INR in the last 168 hours.  Sepsis Markers No results for input(s): LATICACIDVEN, PROCALCITON, O2SATVEN in the last 168 hours.  ABG  Recent Labs Lab Apr 30, 2016 2322  PHART 7.398  PCO2ART 35.1  PO2ART 107.0*    Liver Enzymes  Recent Labs Lab 04/08/16 0140  AST 25  ALT 14  ALKPHOS 41  BILITOT 1.1  ALBUMIN 3.2*    Cardiac Enzymes No results for input(s): TROPONINI, PROBNP in the last 168 hours.  Glucose  Recent  Labs Lab 04/08/16 1243 04/08/16 1644 04/08/16 2030 04/08/16 2339 04/09/16 0351 04/09/16 0735  GLUCAP 136* 123* 140* 132* 128* 132*    Imaging Mr Brain Wo Contrast  Result Date: 04/08/2016 CLINICAL DATA:  24 hour follow-up.  Status post tPA and embolectomy. EXAM: MRI HEAD WITHOUT CONTRAST TECHNIQUE: Multiplanar, multiecho pulse sequences of the brain and surrounding structures were obtained without intravenous contrast. COMPARISON:  Head CT from yesterday.  Brain MRI 07/02/2015 FINDINGS: Calvarium and upper cervical spine: No focal marrow signal abnormality. Orbits: Gaze deviated to the left. Sinuses and Mastoids: Nasopharyngeal fluid level in the setting of intubation. Brain: Restricted diffusion throughout nearly the entire  left MCA territory, with patchy sparing at the posterior border zone and sparing of the left putamen and caudate head. There is associated cytotoxic edema with mild local mass effect. 3 or 4 punctate acute infarcts in the high right posterior frontal cortex. Left M1 thrombus is re- demonstrated. No acute hemorrhage, hydrocephalus, or midline shift. Chronic small vessel ischemic changes seen in the cerebral white matter. IMPRESSION: 1. Acute infarct involving the large majority of the left MCA territory. Recurrent/persistent thrombosis of the left M1 segment. No hemorrhagic conversion. 2. Few punctate acute infarcts in the high right frontal cortex. Electronically Signed   By: Marnee Spring M.D.   On: 04/08/2016 14:39   Dg Chest Portable 1 View  Result Date: 04/09/2016 CLINICAL DATA:  Intubation. EXAM: PORTABLE CHEST 1 VIEW COMPARISON:  04/22/2016. FINDINGS: Endotracheal tube noted with tip 2.4 cm above the carina. NG tube noted with tip below left hemidiaphragm. Cardiomegaly with mild pulmonary vascular prominence. Mild bibasilar atelectasis and/or infiltrates. Small left pleural effusion cannot be excluded. No pneumothorax. IMPRESSION: 1. Endotracheal tube tip 2.4 cm above the carina. NG tube noted with tip below left hemidiaphragm. 2.  Cardiomegaly with mild pulmonary vascular prominence. 3. Mild bibasilar atelectasis and/or infiltrates . Small bilateral pleural effusions. Electronically Signed   By: Maisie Fus  Register   On: 04/09/2016 07:29   Dg Abd Portable 1v  Result Date: 04/08/2016 CLINICAL DATA:  Nasogastric tube placement. EXAM: PORTABLE ABDOMEN - 1 VIEW COMPARISON:  04/08/2016 and CT of the chest 07/23/2015 FINDINGS: A nasogastric tube has been repositioned, tip overlying the level of the mid stomach. Bowel gas pattern is nonobstructive. There is bibasilar atelectasis. Surgical clips are noted in the right upper quadrant of the abdomen. IMPRESSION: Repositioned nasogastric tube, tip overlying the  level of the mid stomach Electronically Signed   By: Norva Pavlov M.D.   On: 04/08/2016 16:33   Dg Abd Portable 1v  Result Date: 04/08/2016 CLINICAL DATA:  Enteric tube placement EXAM: PORTABLE ABDOMEN - 1 VIEW COMPARISON:  10/23/2011 CT abdomen/pelvis FINDINGS: Enteric tube is coiled in the lower thoracic esophagus with the tip oriented superiorly in the mid thoracic esophagus. No disproportionately dilated small bowel loops. No evidence of pneumatosis or pneumoperitoneum. Surgical clips in the right upper quadrant of the abdomen. Mild patchy opacities at both lung bases. IMPRESSION: Malpositioned enteric tube, which is coiled in the lower thoracic esophagus with the tip oriented superiorly in the mid thoracic esophagus. Recommend repositioning / replacement. Nonobstructive bowel gas pattern. These results will be called to the ordering clinician or representative by the Radiologist Assistant, and communication documented in the PACS or zVision Dashboard. Electronically Signed   By: Delbert Phenix M.D.   On: 04/08/2016 13:12     STUDIES:  CT head 8/9 > L MCA infarct  CULTURES: None.  ANTIBIOTICS: None.  SIGNIFICANT EVENTS:  8/9 - CVA, IV TPA, to IR. To ICU for recovery  LINES/TUBES: ETT 8/9 >  DISCUSSION: Very independent 82 year old suffered R MCA stroke 8/9. Received IV TPA, then to IR. Out to ICU on vent. More alert, but not following commands. Left sided purposeful movement noted.( Will reach for ETT)  ASSESSMENT / PLAN:  NEUROLOGIC A:   Acute encephalopathy. L MCA infarct - s/p tPA and partial revascularization / mechanical thrombolysis attempts per IR 8/9. More alert and purposeful with Left hand, but not following commands. P:   Limit sedation as able - PRN fentanyl  RASS goal: 0 to -1. Daily WUA. Stroke workup per neuro. Hold outpatient sertraline.   PULMONARY A: Inability to protect airway in the post-operative setting.  Not Following Commands P:   Full vent  support.  Daily SBT - tolerating CPAP 5/5 well 8/11 F/u CXR   CARDIOVASCULAR A:  H/o A.fib, HTN, MI, CAD. P:  SBP goal 120-140 per Dr. Corliss Skains. Off cardene - resume if needed for BP control  Continue outpatient atorvastatin. Hold outpatient furosemide, nitro, lisinopril.  RENAL A:   No acute issues. Incontinent, but bladder scans show clearing. P:   BMP in AM.  GASTROINTESTINAL A:   GI prophylaxis. Tolerating Nutrition. ++ BS and passing gas P:   SUP: famotidine. Begin TF 8/10  HEMATOLOGIC A:   TPA administered 1530. VTE prophylaxis. HGB 11.3 ( 8/11) No signs obvious bleeding P: SCD's. CBC in AM. Trend platelets   INFECTIOUS A:   No indication of infection. P:   Monitor clinically. WBC trending down 10.8 ( 8/11)  ENDOCRINE A:   Hypothyroidism - TSH 5.3 P:   Continue outpatient synthroid - change to Per tube 150mg /day Monitor CBG's   FAMILY  Updates: 2 daughters updated at bedside 8/11  Inter-disciplinary family meet or Palliative Care meeting due by:  8/16.  Bevelyn Ngo, AGACNP-BC [facility] Pulmonary/Critical Care Medicine  [phone]

## 2016-04-09 NOTE — Progress Notes (Signed)
Referring Physician(s): Dr Delia Heady  Supervising Physician: Julieanne Cotton  Patient Status:  Inpatient  Chief Complaint:  CVA L MCA revascularization 8/9  Subjective:  On vent Did follow command to move on Left today  Allergies: Review of patient's allergies indicates no known allergies.  Medications: Prior to Admission medications   Not on File     Vital Signs: BP (!) 137/52   Pulse 76   Temp 98.8 F (37.1 C) (Axillary)   Resp 14   Ht  (1.6 m) Comment: per RN  Wt 135 lb 9.3 oz (61.5 kg)   SpO2 100%   BMI 24.02 kg/m   Physical Exam  Neurological:  Moving left side only Does move to command No visualization of movement on Rt Seems to open eyes to me  Skin: Skin is warm and dry.  Rt groin site clean and dry NT no bleeding No hematoma 2+pulses on Rt fot  Nursing note and vitals reviewed.   Imaging: Ct Angio Head W Or Wo Contrast  Result Date: 04-15-16 CLINICAL DATA:  82 year old female with altered mental status. Subsequent encounter. EXAM: CT ANGIOGRAPHY HEAD AND NECK TECHNIQUE: Multidetector CT imaging of the head and neck was performed using the standard protocol during bolus administration of intravenous contrast. Multiplanar CT image reconstructions and MIPs were obtained to evaluate the vascular anatomy. Carotid stenosis measurements (when applicable) are obtained utilizing NASCET criteria, using the distal internal carotid diameter as the denominator. CONTRAST:  50 cc Isovue 370. COMPARISON:  None. Per history of Dr. Hilda Blades, patient has [facility] CT scan read as a code stroke. These images are currently unavailable. This is being worked on by Haematologist. FINDINGS: CT HEAD Brain: Left middle cerebral artery distribution early infarct with poor collaterals as discussed below. This may end up being a large distribution infarct. Global atrophy. No intracranial hemorrhage. No intracranial mass. Calvarium and skull base: No fracture.  Paranasal sinuses: Mucosal thickening maxillary sinuses greater on left measuring up to 1 cm. Orbits: Conjugate gaze to the left. CTA NECK Aortic arch: Common origin innominate and left common carotid artery. Plaque aortic arch. Plaque with mild narrowing proximal left subclavian artery. 2 cm above its origin, kink with mild to slightly moderate narrowing left subclavian artery. Right carotid system: Moderate narrowing origin of the right common carotid artery. Plaque right carotid bifurcation with 50% maximal diameter stenosis. Left carotid system: Plaque left carotid bifurcation with less than 50% diameter stenosis. Vertebral arteries:Plaque with mild narrowing origin left vertebral artery. Ectatic proximal left vertebral artery. Left vertebral artery is the dominant vertebral artery. Right vertebral artery is congenitally small. Skeleton: No obvious fracture. Rotation C1 on C2 may be related to head positioning. Other neck: Heterogeneous large thyroid gland with substernal extension on left. No worrisome dominant mass. Upper chest: No worrisome lung apical lesion. CTA HEAD Anterior circulation: Plaque cavernous segment internal carotid artery bilaterally mild narrowing bilaterally. Fetal origin right posterior cerebral artery. Abrupt cut off of flow distal M1 segment left middle cerebral artery (thrombus) with poor collateral flow. Posterior circulation: Left vertebral artery is dominant with mild narrowing. Right vertebral artery diminutive in size, irregular and narrowed throughout its course. Ectatic basilar artery with mild narrowing. Venous sinuses: Patent. Anatomic variants: As above. Delayed phase: Delayed filling in of left middle cerebral artery branches. IMPRESSION: CT HEAD Left middle cerebral artery distribution early infarct with poor collaterals as discussed below. This may end up being a large infarct. CTA NECK Common origin innominate and left  common carotid artery. Plaque aortic arch. Plaque with  mild narrowing proximal left subclavian artery. 2 cm above its origin, kink with mild to slightly moderate narrowing left subclavian artery. Moderate narrowing origin of the right common carotid artery. Plaque right carotid bifurcation with 50% maximal diameter stenosis. Plaque left carotid bifurcation with less than 50% diameter stenosis. Plaque with mild narrowing origin left vertebral artery. Ectatic proximal left vertebral artery. Left vertebral artery is the dominant vertebral artery. Right vertebral artery is congenitally small. No obvious fracture. Rotation C1 on C2 may be related to head positioning. Heterogeneous large thyroid gland with substernal extension on left. No worrisome dominant mass. CTA HEAD Abrupt cut off of flow distal M1 segment left middle cerebral artery (thrombus) with poor collateral flow. Delayed filling in of left middle cerebral artery branches. Plaque cavernous segment internal carotid artery bilaterally mild narrowing bilaterally. Fetal origin right posterior cerebral artery. Left vertebral artery is dominant with mild narrowing. Right vertebral artery diminutive in size, irregular and narrowed throughout its course. Ectatic basilar artery with mild narrowing. These results were called by telephone at the time of interpretation on 04/24/2016 at 5:47 pm to Dr. Rose Fillers , who verbally acknowledged these results. Electronically Signed   By: Lacy Duverney M.D.   On: 04/03/2016 18:19   Ct Head Wo Contrast  Result Date: 04/11/2016 CLINICAL DATA:  Initial evaluation for acute left MCA embolism, status post endovascular intervention with minimal revascularization. EXAM: CT HEAD WITHOUT CONTRAST TECHNIQUE: Contiguous axial images were obtained from the base of the skull through the vertex without intravenous contrast. COMPARISON:  Prior CT and CTA from earlier the same day. FINDINGS: Increased hyperdensity within the left M1 segment, likely reflecting residual thrombus and/ or IV  contrast from recent angiogram. Subtle hyperdensity within the cortical gray matter of the anterior left frontal lobe more superiorly towards the vertex likely reflects associated contrast staining. Minimal contrast staining within the left basal ganglia as well. There is subtle hypodensity within the left insular cortex and left temporal lobe, concerning for evolving left MCA territory infarct. Probable developing ischemia within the left temporal lobe and anterior left frontal lobe. No significant mass effect at this time. No definite acute intracranial hemorrhage. Gray-white matter differentiation otherwise maintained. No other acute large vessel territory infarct. No mass lesion or midline shift. No hydrocephalus. No extra-axial fluid collection. Atrophy with chronic microvascular ischemic changes again noted. Scalp soft tissues demonstrate no acute abnormality. Globes and orbits within normal limits. Senescent ocular calcifications noted. Scattered mucosal thickening within the ethmoidal air cells, right sphenoid sinus, and maxillary sinuses. Patient is intubated. No mastoid effusion. Calvarium intact. IMPRESSION: 1. Subtle loss of gray-white matter differentiation with hypodensity within the left MCA territory as above, compatible with evolving left MCA territory infarct. 2. Hyperdensity within the left M1 segment, which may reflect residual thrombus and/or contrast from recent arteriogram. Associated contrast staining within the left basal ganglia and anterior left frontal lobe. No definite acute intracranial hemorrhage. 3. No other acute intracranial process. Electronically Signed   By: Rise Mu M.D.   On: 04/15/2016 22:37   Ct Angio Neck W Or Wo Contrast  Result Date: 04/11/2016 CLINICAL DATA:  82 year old female with altered mental status. Subsequent encounter. EXAM: CT ANGIOGRAPHY HEAD AND NECK TECHNIQUE: Multidetector CT imaging of the head and neck was performed using the standard  protocol during bolus administration of intravenous contrast. Multiplanar CT image reconstructions and MIPs were obtained to evaluate the vascular anatomy. Carotid stenosis measurements (when applicable)  are obtained utilizing NASCET criteria, using the distal internal carotid diameter as the denominator. CONTRAST:  50 cc Isovue 370. COMPARISON:  None. Per history of Dr. Hilda Blades, patient has [facility] CT scan read as a code stroke. These images are currently unavailable. This is being worked on by Haematologist. FINDINGS: CT HEAD Brain: Left middle cerebral artery distribution early infarct with poor collaterals as discussed below. This may end up being a large distribution infarct. Global atrophy. No intracranial hemorrhage. No intracranial mass. Calvarium and skull base: No fracture. Paranasal sinuses: Mucosal thickening maxillary sinuses greater on left measuring up to 1 cm. Orbits: Conjugate gaze to the left. CTA NECK Aortic arch: Common origin innominate and left common carotid artery. Plaque aortic arch. Plaque with mild narrowing proximal left subclavian artery. 2 cm above its origin, kink with mild to slightly moderate narrowing left subclavian artery. Right carotid system: Moderate narrowing origin of the right common carotid artery. Plaque right carotid bifurcation with 50% maximal diameter stenosis. Left carotid system: Plaque left carotid bifurcation with less than 50% diameter stenosis. Vertebral arteries:Plaque with mild narrowing origin left vertebral artery. Ectatic proximal left vertebral artery. Left vertebral artery is the dominant vertebral artery. Right vertebral artery is congenitally small. Skeleton: No obvious fracture. Rotation C1 on C2 may be related to head positioning. Other neck: Heterogeneous large thyroid gland with substernal extension on left. No worrisome dominant mass. Upper chest: No worrisome lung apical lesion. CTA HEAD Anterior circulation: Plaque cavernous segment internal  carotid artery bilaterally mild narrowing bilaterally. Fetal origin right posterior cerebral artery. Abrupt cut off of flow distal M1 segment left middle cerebral artery (thrombus) with poor collateral flow. Posterior circulation: Left vertebral artery is dominant with mild narrowing. Right vertebral artery diminutive in size, irregular and narrowed throughout its course. Ectatic basilar artery with mild narrowing. Venous sinuses: Patent. Anatomic variants: As above. Delayed phase: Delayed filling in of left middle cerebral artery branches. IMPRESSION: CT HEAD Left middle cerebral artery distribution early infarct with poor collaterals as discussed below. This may end up being a large infarct. CTA NECK Common origin innominate and left common carotid artery. Plaque aortic arch. Plaque with mild narrowing proximal left subclavian artery. 2 cm above its origin, kink with mild to slightly moderate narrowing left subclavian artery. Moderate narrowing origin of the right common carotid artery. Plaque right carotid bifurcation with 50% maximal diameter stenosis. Plaque left carotid bifurcation with less than 50% diameter stenosis. Plaque with mild narrowing origin left vertebral artery. Ectatic proximal left vertebral artery. Left vertebral artery is the dominant vertebral artery. Right vertebral artery is congenitally small. No obvious fracture. Rotation C1 on C2 may be related to head positioning. Heterogeneous large thyroid gland with substernal extension on left. No worrisome dominant mass. CTA HEAD Abrupt cut off of flow distal M1 segment left middle cerebral artery (thrombus) with poor collateral flow. Delayed filling in of left middle cerebral artery branches. Plaque cavernous segment internal carotid artery bilaterally mild narrowing bilaterally. Fetal origin right posterior cerebral artery. Left vertebral artery is dominant with mild narrowing. Right vertebral artery diminutive in size, irregular and narrowed  throughout its course. Ectatic basilar artery with mild narrowing. These results were called by telephone at the time of interpretation on April 28, 2016 at 5:47 pm to Dr. Rose Fillers , who verbally acknowledged these results. Electronically Signed   By: Lacy Duverney M.D.   On: 28-Apr-2016 18:19   Mr Brain Wo Contrast  Result Date: 04/08/2016 CLINICAL DATA:  24 hour follow-up.  Status post tPA and embolectomy. EXAM: MRI HEAD WITHOUT CONTRAST TECHNIQUE: Multiplanar, multiecho pulse sequences of the brain and surrounding structures were obtained without intravenous contrast. COMPARISON:  Head CT from yesterday.  Brain MRI 07/02/2015 FINDINGS: Calvarium and upper cervical spine: No focal marrow signal abnormality. Orbits: Gaze deviated to the left. Sinuses and Mastoids: Nasopharyngeal fluid level in the setting of intubation. Brain: Restricted diffusion throughout nearly the entire left MCA territory, with patchy sparing at the posterior border zone and sparing of the left putamen and caudate head. There is associated cytotoxic edema with mild local mass effect. 3 or 4 punctate acute infarcts in the high right posterior frontal cortex. Left M1 thrombus is re- demonstrated. No acute hemorrhage, hydrocephalus, or midline shift. Chronic small vessel ischemic changes seen in the cerebral white matter. IMPRESSION: 1. Acute infarct involving the large majority of the left MCA territory. Recurrent/persistent thrombosis of the left M1 segment. No hemorrhagic conversion. 2. Few punctate acute infarcts in the high right frontal cortex. Electronically Signed   By: Marnee SpringJonathon  Watts M.D.   On: 04/08/2016 14:39   Dg Chest Portable 1 View  Result Date: 04/09/2016 CLINICAL DATA:  Intubation. EXAM: PORTABLE CHEST 1 VIEW COMPARISON:  2016-07-27. FINDINGS: Endotracheal tube noted with tip 2.4 cm above the carina. NG tube noted with tip below left hemidiaphragm. Cardiomegaly with mild pulmonary vascular prominence. Mild bibasilar  atelectasis and/or infiltrates. Small left pleural effusion cannot be excluded. No pneumothorax. IMPRESSION: 1. Endotracheal tube tip 2.4 cm above the carina. NG tube noted with tip below left hemidiaphragm. 2.  Cardiomegaly with mild pulmonary vascular prominence. 3. Mild bibasilar atelectasis and/or infiltrates . Small bilateral pleural effusions. Electronically Signed   By: Maisie Fushomas  Register   On: 04/09/2016 07:29   Dg Chest Portable 1 View  Result Date: 04/26/2016 CLINICAL DATA:  Stroke.  Possible aspiration. EXAM: PORTABLE CHEST 1 VIEW COMPARISON:  Chest CT dated 07/23/2015 and chest x-ray dated 07/09/2015 FINDINGS: Chronic mild cardiomegaly. Pulmonary vascularity is normal. No infiltrates or effusions. Accentuation of the interstitial markings at the bases is felt to be due to a relatively shallow inspiration. No acute bone abnormality. Calcification in the thoracic aorta. IMPRESSION: No acute abnormality.  Chronic mild cardiomegaly. Aortic atherosclerosis. Electronically Signed   By: Francene BoyersJames  Maxwell M.D.   On: 2016-07-27 18:25   Dg Abd Portable 1v  Result Date: 04/08/2016 CLINICAL DATA:  Nasogastric tube placement. EXAM: PORTABLE ABDOMEN - 1 VIEW COMPARISON:  04/08/2016 and CT of the chest 07/23/2015 FINDINGS: A nasogastric tube has been repositioned, tip overlying the level of the mid stomach. Bowel gas pattern is nonobstructive. There is bibasilar atelectasis. Surgical clips are noted in the right upper quadrant of the abdomen. IMPRESSION: Repositioned nasogastric tube, tip overlying the level of the mid stomach Electronically Signed   By: Norva PavlovElizabeth  Brown M.D.   On: 04/08/2016 16:33   Dg Abd Portable 1v  Result Date: 04/08/2016 CLINICAL DATA:  Enteric tube placement EXAM: PORTABLE ABDOMEN - 1 VIEW COMPARISON:  10/23/2011 CT abdomen/pelvis FINDINGS: Enteric tube is coiled in the lower thoracic esophagus with the tip oriented superiorly in the mid thoracic esophagus. No disproportionately dilated  small bowel loops. No evidence of pneumatosis or pneumoperitoneum. Surgical clips in the right upper quadrant of the abdomen. Mild patchy opacities at both lung bases. IMPRESSION: Malpositioned enteric tube, which is coiled in the lower thoracic esophagus with the tip oriented superiorly in the mid thoracic esophagus. Recommend repositioning / replacement. Nonobstructive bowel gas pattern. These results will be  called to the ordering clinician or representative by the Radiologist Assistant, and communication documented in the PACS or zVision Dashboard. Electronically Signed   By: Delbert Phenix M.D.   On: 04/08/2016 13:12    Labs:  CBC:  Recent Labs  04/08/16 0030 04/08/16 0430 04/09/16 0519  WBC 11.3* 12.3* 10.8*  HGB 11.5* 12.7 11.3*  HCT 35.5* 38.9 35.3*  PLT 178 206 183    COAGS: No results for input(s): INR, APTT in the last 8760 hours.  BMP:  Recent Labs  04/08/16 0140 04/08/16 0430 04/09/16 0519  NA 138 139 141  K 3.2* 3.5 4.3  CL 107 106 110  CO2 21* 21* 25  GLUCOSE 226* 196* 124*  BUN 11 10 13   CALCIUM 8.1* 8.1* 8.2*  CREATININE 0.94 0.91 0.90  GFRNONAA 50* 52* 53*  GFRAA 58* >60 >60    LIVER FUNCTION TESTS:  Recent Labs  04/08/16 0140  BILITOT 1.1  AST 25  ALT 14  ALKPHOS 41  PROT 5.7*  ALBUMIN 3.2*    Assessment and Plan:  CVA L MCA revascularization 8/9 Some movement on Left On vent  Electronically Signed: Obe Ahlers A 04/09/2016, 8:21 AM   I spent a total of 15 Minutes at the the patient's bedside AND on the patient's hospital floor or unit, greater than 50% of which was counseling/coordinating care for CVA/ L MCA revas

## 2016-04-09 NOTE — Progress Notes (Signed)
OT Cancellation Note  Patient Details Name: Destiny Davis MRN: [id number] DOB: 1918/10/25   Cancelled Treatment:    Reason Eval/Treat Not Completed: Patient not medically ready (on bedrest.)  [facility]WARD,HILLARY  Kire Ferg, OTR/L  [phone] 04/09/2016 04/09/2016, 7:53 AM

## 2016-04-09 NOTE — Progress Notes (Signed)
SLP Cancellation Note  Patient Details Name: Destiny Davis MRN: [id number] DOB: [birth year]   Cancelled treatment:       Reason Eval/Treat Not Completed: Patient not medically ready. Will sign off   Lavette Yankovich, Riley NearingBonnie Caroline 04/09/2016, 7:34 AM

## 2016-04-09 NOTE — Progress Notes (Signed)
Echocardiogram 2D Echocardiogram has been performed.  Destiny Davis 04/09/2016, 3:22 PM

## 2016-04-10 ENCOUNTER — Inpatient Hospital Stay (HOSPITAL_COMMUNITY): Payer: Medicare Other

## 2016-04-10 DIAGNOSIS — Z515 Encounter for palliative care: Secondary | ICD-10-CM

## 2016-04-10 DIAGNOSIS — Z66 Do not resuscitate: Secondary | ICD-10-CM

## 2016-04-10 DIAGNOSIS — I63032 Cerebral infarction due to thrombosis of left carotid artery: Secondary | ICD-10-CM

## 2016-04-10 DIAGNOSIS — J9690 Respiratory failure, unspecified, unspecified whether with hypoxia or hypercapnia: Secondary | ICD-10-CM

## 2016-04-10 LAB — CBC WITH DIFFERENTIAL/PLATELET
Basophils Absolute: 0 10*3/uL (ref 0.0–0.1)
Basophils Relative: 0 %
Eosinophils Absolute: 0.2 10*3/uL (ref 0.0–0.7)
Eosinophils Relative: 2 %
HEMATOCRIT: 35.1 % — AB (ref 36.0–46.0)
Hemoglobin: 10.9 g/dL — ABNORMAL LOW (ref 12.0–15.0)
LYMPHS PCT: 13 %
Lymphs Abs: 1.4 10*3/uL (ref 0.7–4.0)
MCH: 29.5 pg (ref 26.0–34.0)
MCHC: 31.1 g/dL (ref 30.0–36.0)
MCV: 95.1 fL (ref 78.0–100.0)
MONO ABS: 1.5 10*3/uL — AB (ref 0.1–1.0)
MONOS PCT: 14 %
NEUTROS ABS: 7.9 10*3/uL — AB (ref 1.7–7.7)
Neutrophils Relative %: 71 %
Platelets: 164 10*3/uL (ref 150–400)
RBC: 3.69 MIL/uL — ABNORMAL LOW (ref 3.87–5.11)
RDW: 14.8 % (ref 11.5–15.5)
WBC: 11 10*3/uL — ABNORMAL HIGH (ref 4.0–10.5)

## 2016-04-10 LAB — BASIC METABOLIC PANEL
ANION GAP: 7 (ref 5–15)
BUN: 14 mg/dL (ref 6–20)
CALCIUM: 8.2 mg/dL — AB (ref 8.9–10.3)
CO2: 25 mmol/L (ref 22–32)
Chloride: 108 mmol/L (ref 101–111)
Creatinine, Ser: 0.7 mg/dL (ref 0.44–1.00)
GFR calc Af Amer: >60 mL/min (ref >60)
GFR calc non Af Amer: >60 mL/min (ref >60)
GLUCOSE: 114 mg/dL — AB (ref 65–99)
Potassium: 4.1 mmol/L (ref 3.5–5.1)
Sodium: 140 mmol/L (ref 135–145)

## 2016-04-10 LAB — GLUCOSE, CAPILLARY
Glucose-Capillary: 107 mg/dL — ABNORMAL HIGH (ref 65–99)
Glucose-Capillary: 127 mg/dL — ABNORMAL HIGH (ref 65–99)
Glucose-Capillary: 108 mg/dL — ABNORMAL HIGH (ref 65–99)

## 2016-04-10 MED ORDER — SCOPOLAMINE 1 MG/3DAYS TD PT72
1.0000 | MEDICATED_PATCH | TRANSDERMAL | Status: DC
Start: 2016-04-10 — End: 2016-04-10
  Administered 2016-04-10: 1.5 mg via TRANSDERMAL
  Filled 2016-04-10: qty 1

## 2016-04-10 MED ORDER — MORPHINE BOLUS VIA INFUSION: 5.0000 mg | INTRAVENOUS | Status: DC | PRN

## 2016-04-10 MED ORDER — SODIUM CHLORIDE 0.9 % IV SOLN
1.0000 ug/h | INTRAVENOUS | Status: DC
  Administered 2016-04-10: 25 ug/h via INTRAVENOUS
  Filled 2016-04-10: qty 50

## 2016-04-10 MED ORDER — LORAZEPAM BOLUS VIA INFUSION: 2.0000 mg | INTRAVENOUS | Status: DC | PRN

## 2016-04-10 MED ORDER — MORPHINE SULFATE (PF) 2 MG/ML IV SOLN: 1.0000 mg | INTRAVENOUS | Status: DC | PRN

## 2016-04-10 MED ORDER — LORAZEPAM 2 MG/ML IJ SOLN
1.0000 mg | INTRAMUSCULAR | Status: DC | PRN
  Administered 2016-04-10: 2 mg via INTRAVENOUS
  Filled 2016-04-10: qty 1

## 2016-04-12 ENCOUNTER — Telehealth: Payer: Self-pay | Admitting: Neurology

## 2016-04-12 NOTE — Telephone Encounter (Signed)
[facility] [phone] called regarding where to send death certificate.

## 2016-04-12 NOTE — Telephone Encounter (Signed)
Rn call Corrie DandyMary back about the death certificate. Rn stated the death certificate can be brought to the office this week.Rn stated Dr. Pearlean BrownieSethi saw the patient last at the hospital.Rn also stated Dr.Sethi can fill out the death certificate this week because he works in the hospital too. Corrie DandyMary put Rn on hold and later said, the death certificate was already done and sign.

## 2016-04-13 ENCOUNTER — Encounter (HOSPITAL_COMMUNITY): Payer: Self-pay | Admitting: Interventional Radiology

## 2016-04-30 NOTE — Progress Notes (Signed)
Patient extubated to room air by RT. Pt is currently breathing comfortable and tolerated extubation very well. Family, RT and RN at bedside.

## 2016-04-30 NOTE — Progress Notes (Signed)
Patient transported to CT Scan and returned to unit without any complication. 

## 2016-04-30 NOTE — Progress Notes (Signed)
PT Cancellation Note  Patient Details Name: Destiny BradfordMargaret Croston MRN: [id number] DOB: December 19, 1918   Cancelled Treatment:    Reason Eval/Treat Not Completed: Medical issues which prohibited therapy (Nurse stated pt/family considering withdrawal of care. Check back Monday.)   Tawni MillersWhite, Callin Ashe F 10-22-2015, 11:40 AM Eber Jonesawn Caliyah Sieh,PT Acute Rehabilitation [phone])

## 2016-04-30 NOTE — Progress Notes (Signed)
STROKE TEAM PROGRESS NOTE   HISTORY OF PRESENT ILLNESS (per record) Destiny Davis is an 82 y.o. female who presents as a transfer from [place]. It is reported that at 2:30p this afternoon 04/02/2016 she fell and was experiencing a facial and right hemiparesis (LKW). She arrived in the emergency room within the time window for TPA. The telemetry neurologist recommended administration of TPA. After initiating tPA therapy the ER physician requested transfer of the patient to [facility] emergency room for further evaluation and treatment.  Upon arrival the patient was taken to CTA for further evaluation. 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 This was discussed with the family. Destiny Davis's baseline status was discussed in length. The family reports that Destiny Davis lived independently and was able to take care of herself in all aspects. He report she was able to prepare her own meals bathing herself clothing herself and otherwise was functionally independent. We discussed the fact that she had been previously seen by neurologist and was noted to have some issues with her cognitive function. It was also noted that at some point within the past year she was started on Namenda. The family reports that they felt her symptoms were mild and that the Namenda provided no benefit. It was discontinued by the family. The family reports that Destiny Davis does not drive or maintain her own finances. However they report she was not actually having any difficulty driving there was simply concerned about her vision and her age. They assisted with her finances as they felt they were somewhat complex. However they report that she would still write her own checks.  The possibility of interventional radiology was discussed with the family at length. All 3 children felt that they would like for [place] to proceed with interventional therapy. The case was discussed with Dr. Corena Pilgrim. Dr. Corena Pilgrim came to the hospital  and discussed the risk and benefits of interventional neuroradiology with the family. After thorough discussion of the risk and benefits of the procedure the family requested additional intervention. The patient was subsequently taken to the interventional radiology suite .S/P endovascular treatment of LT MCA occlusion ,with minimal revascularization. x1pass with solitaire 4 mm x 40 mm retrieval device and multiple attempts  With  mechanical thrombolysis She was admitted to the neuro ICU for further evaluation and treatment.   SUBJECTIVE (INTERVAL HISTORY) No significant improvement in mental status.  CT Brain shows very large ischemic infarct with worsening midline shift.  Family has met among themselves and I spoke to all of them, including the POA for healthcare, and they are wishing to pursue comfort measures only.    OBJECTIVE Temp:  [98 F (36.7 C)-100.4 F (38 C)] 98.1 F (36.7 C) (08/12 0800) Pulse Rate:  [41-89] 54 (08/12 0900) Cardiac Rhythm: Normal sinus rhythm (08/12 0800) Resp:  [15-23] 15 (08/12 0900) BP: (112-167)/(47-79) 153/55 (08/12 0900) SpO2:  [98 %-100 %] 100 % (08/12 0900) FiO2 (%):  [40 %] 40 % (08/12 0900) Weight:  [61.5 kg (135 lb 9.3 oz)] 61.5 kg (135 lb 9.3 oz) (08/12 0400)  CBC:   Recent Labs Lab 04/08/16 0430 04/09/16 0519 Apr 16, 2016 0618  WBC 12.3* 10.8* 11.0*  NEUTROABS 11.4*  --  7.9*  HGB 12.7 11.3* 10.9*  HCT 38.9 35.3* 35.1*  MCV 92.2 92.7 95.1  PLT 206 183 638    Basic Metabolic Panel:   Recent Labs Lab 04/09/16 0519 04/09/16 1653 16-Apr-2016 0618  NA 141  --  140  K 4.3  --  4.1  CL 110  --  108  CO2 25  --  25  GLUCOSE 124*  --  114*  BUN 13  --  14  CREATININE 0.90  --  0.70  CALCIUM 8.2*  --  8.2*  MG 1.9 2.0  --   PHOS 1.5* 1.6*  --     Lipid Panel:     Component Value Date/Time   CHOL 117 04/08/2016 0430   TRIG 58 04/08/2016 0430   HDL 45 04/08/2016 0430   CHOLHDL 2.6 04/08/2016 0430   VLDL 12 04/08/2016 0430    LDLCALC 60 04/08/2016 0430   HgbA1c:  Lab Results  Component Value Date   HGBA1C 5.3 04/08/2016   Urine Drug Screen: No results found for: LABOPIA, COCAINSCRNUR, LABBENZ, AMPHETMU, THCU, LABBARB    IMAGING  Ct Head Wo Contrast 04/22/16 1. Overall stable size and distribution of large evolving left MCA territory infarct. No evidence for hemorrhagic transformation. Localized mass effect with mild 3 mm of left-to-right shift.  2. No other new acute intracranial process.    Mr Brain Wo Contrast 04/08/2016 1. Acute infarct involving the large majority of the left MCA territory.   Recurrent/persistent thrombosis of the left M1 segment. No hemorrhagic conversion.  2. Few punctate acute infarcts in the high right frontal cortex.    Dg Chest Port 1 View 2016-04-22 Endotracheal tube 2.5 cm above the carina unchanged. Bibasilar atelectasis unchanged from the prior study.    Dg Chest Portable 1 View 04/09/2016 1. Endotracheal tube tip 2.4 cm above the carina. NG tube noted with tip below left hemidiaphragm.  2.  Cardiomegaly with mild pulmonary vascular prominence.  3. Mild bibasilar atelectasis and/or infiltrates . Small bilateral pleural effusions.     Dg Abd Portable 1v 04/08/2016 Repositioned nasogastric tube, tip overlying the level of the mid stomach     Dg Abd Portable 1v 04/08/2016 Malpositioned enteric tube, which is coiled in the lower thoracic esophagus with the tip oriented superiorly in the mid thoracic esophagus. Recommend repositioning / replacement. Nonobstructive bowel gas pattern.    Cerebral Angiogram  S/P endovascular treatment of LT MCA occlusion ,with minimal revascularization. x1pass with solitaire 4 mm x 40 mm retrieval device and multiple attempts  With  mechanical thrombolysis.    PHYSICAL EXAM Elderly Caucasian lady intubated . Not in distress. . Afebrile. Head is nontraumatic. Neck is supple without bruit.    Cardiac exam no murmur or gallop. Lungs  are clear to auscultation. Distal pulses are well felt. Neurological Exam :  Patient is intubated. Eyes are closed and not following commands.  Withdraws to pain all 4 extremities, not as much on the right.  Right babinski.  Right hyperreflexia.     ASSESSMENT/PLAN Ms. Destiny Davis is a 82 y.o. female with history of atrial fibrillation not on anticoagulation, CHF, CAD, HTN, HLD, CAD and previous stroke presenting with R hemiparesis. She was treated with IV t-PA at [place] 04/04/2016 at 1555 and transferred to [facility] where she received IR with minimal revascularization of LT MCA occlusion with multiple attempts.   Stroke:  Dominant left MCA infarct s/p IV tPA minimal revascularization of LT MCA occlusion with multiple attempts. Infarct  embolic secondary to known atrial fibrillation.   Resultant  R hemiparesis, expressive aphasia, VDRF  Cerebral angio L MCA occlusion  CT head post IR with evolving L MCA infarct, LM1 residual thrombus, contrast staining L BG and anterior L frontal lobe  MRI  Today at 1300  2D  Echo  ordered  LDL 60  HgbA1c - 5.3  SCDs for VTE prophylaxis Diet NPO time specified  No antithrombotic prior to admission, now on No antithrombotic  As withn 24h of tPA. plan aspirin suppository if 24h imaging neg for hemorrhage  Ongoing aggressive stroke risk factor management  Therapy recommendations:  pending   Disposition:  pending (independent but not driving or managing own finances PTA)  Respiratory Failure  Intubated for neuro intervention  CCM managing  Atrial Fibrillation  Home anticoagulation:  none . Had been on coumadin in the past - was stopped 3 years ago due to GIB  Currently, not an anticoagulation candidate post tPA    Hypertension Stable BP goal 120-140 per Dr. Estanislado Pandy Long-term BP goal normotensive  Hyperlipidemia  Home meds:  None listed at this time  LDL 60, goal < 70  Continue statin at discharge if pt on statin  PTA  Diabetes  HgbA1c pending, goal < 7.0  Other Stroke Risk Factors  Advanced age  Coronary artery disease - MI  CHF    Other Active Problems  Hypothyroidism  Hx GIB  Mild cognitive difficulties, started on namenda within past 1 year without change, it was stopped  Anemia - hemoglobin 10.9 ; hematocrit 35.1  Mild leukocytosis 11.0   Hospital day # 3   I have personally examined this patient, reviewed notes, independently viewed imaging studies, participated in medical decision making and plan of care. I have made any additions or clarifications directly to the above note. Agree with note above. She presented with aphasia and dense right hemiplegia due to left middle cerebral artery occlusion and was treated with IV TPA followed by unsuccessful attempt at mechanical embolectomy. She remains at risk for neurological worsening, post TPA hemorrhage, cytotoxic edema and needs close neurological and hemodynamic monitoring with respiratory support. Her prognosis remains guarded at this time. I had a long discussion with the patient's 2 daughters   at the bedside and answered questions. Plan to check MRI scan later today and make decisions about extubation based on infarct size and presence of edema. Strict blood pressure control and  Close neurological monitoring. Family is thinking about DO NOT RESUSCITATE status prior to extubation and feeding tube and nursing home placement versus comfort care. We will make a decision on the next few days prior to extubation Discussed with Dr. Elsworth Soho  This patient is critically ill and at significant risk of neurological worsening, death and care requires constant monitoring of vital signs, hemodynamics,respiratory and cardiac monitoring, extensive review of multiple databases, frequent neurological assessment, discussion with family, other specialists and medical decision making of high complexity.I have made any additions or clarifications directly to  the above note.This critical care time does not reflect procedure time, or teaching time or supervisory time of PA/NP/Med Resident etc but could involve care discussion time.  I spent 50 minutes of neurocritical care time  in the care of  this patient.  Met with family and agree with poor prognosis and poor quality of life without any meaningful recovery.    We will extubate  And initiate comfort care measures only.     Rogue Jury, MD 04/21/16 10:14 AM    To contact Stroke Continuity provider, please refer to [email or web address] After hours, contact General Neurology

## 2016-04-30 NOTE — Procedures (Signed)
Extubation Procedure Note  Patient Details:   Name: Destiny Davis DOB: 01-17-1919 MRN: [id number]   Airway Documentation:     Evaluation  O2 sats: stable throughout Complications: No apparent complications Patient did tolerate procedure well. Bilateral Breath Sounds: Clear, Diminished   RT Note: Patient had been weaning all day on cpap with a pressure support of 8. Patient was weaned down on her ventilator per withdrawal of life sustaining treatment protocol to 5 of pressure support and 30% O2. Family and RN was at bedside. RN titrated medications to ensure patients comfort. Family requested to have the chaplain come see them and once they met with him and were ready to proceed, patient was extubated to room air per protocol. Patient is currently maintaining on room air with no issues. Rt will continue to monitor and assist as needed.   Melina Schools 04/27/16, 3:36 PM

## 2016-04-30 NOTE — Progress Notes (Signed)
PULMONARY / CRITICAL CARE MEDICINE   Name: Cala BradfordMargaret Bickert MRN: [id number] DOB: 04/15/1919    ADMISSION DATE:  04/27/2016 CONSULTATION DATE:  04/28/2016  REFERRING MD:  Dr. Hilda BladesArmstrong (Neurology)  CHIEF COMPLAINT:  CVA  HISTORY OF PRESENT ILLNESS:   Pt had stroke symptoms that started around 2:30 on 04/06/2016. [facility] where she was administered IV TPA, but continue to have symptoms so she was transferred to our facility for further management. Decision was made after a CT angiogram confirmed persistent occlusion of the left MCA for her to go to interventional radiology to attempt thromboliysis. Revascularization was attempted but it appears from the report that she still has a left MCA occlusion. She was intubated for that procedure and pulmonary critical care medicine was consulted for management of the ventilator.  SUBJECTIVE: Remains on vent.  Not responsive to verbal stimuli    VITAL SIGNS: BP (!) 144/47   Pulse (!) 56   Temp 98.1 F (36.7 C) (Axillary)   Resp 16   Ht 5\' 3"  (1.6 m) Comment: per RN  Wt 135 lb 9.3 oz (61.5 kg)   SpO2 100%   BMI 24.02 kg/m   HEMODYNAMICS:    VENTILATOR SETTINGS: Vent Mode: CPAP;PSV FiO2 (%):  [40 %] 40 % Set Rate:  [16 bmp] 16 bmp Vt Set:  [480 mL] 480 mL PEEP:  [5 cmH20] 5 cmH20 Pressure Support:  [5 cmH20-8 cmH20] 8 cmH20 Plateau Pressure:  [11 cmH20-18 cmH20] 18 cmH20  INTAKE / OUTPUT: I/O last 3 completed shifts: In: 3315 [I.V.:1685; NG/GT:1480; IV Piggyback:150] Out: -   PHYSICAL EXAMINATION: General: Elderly female, resting in bed, in NAD. Neuro: opens eyes, tracks at times, does not follow commands,  HEENT: /AT. PERRL, sclerae anicteric. Cardiovascular: RRR, no M/R/G.  Lungs: Respirations even and unlabored on CPAP/ PS weaning   CTA bilaterally, No W/R/R.   Abdomen: BS x 4, soft, NT/ND.  Musculoskeletal: No gross deformities, no edema.  Skin: Intact, warm, no rashes.   LABS:  BMET  Recent  Labs Lab 04/08/16 0430 04/09/16 0519 11-13-2015 0618  NA 139 141 140  K 3.5 4.3 4.1  CL 106 110 108  CO2 21* 25 25  BUN 10 13 14   CREATININE 0.91 0.90 0.70  GLUCOSE 196* 124* 114*    Electrolytes  Recent Labs Lab 04/08/16 0430  04/08/16 1600 04/09/16 0519 04/09/16 1653 11-13-2015 0618  CALCIUM 8.1*  --   --  8.2*  --  8.2*  MG  --   < > 1.8 1.9 2.0  --   PHOS  --   < > 2.7 1.5* 1.6*  --   < > = values in this interval not displayed.  CBC  Recent Labs Lab 04/08/16 0430 04/09/16 0519 11-13-2015 0618  WBC 12.3* 10.8* 11.0*  HGB 12.7 11.3* 10.9*  HCT 38.9 35.3* 35.1*  PLT 206 183 164    Coag's No results for input(s): APTT, INR in the last 168 hours.  Sepsis Markers No results for input(s): LATICACIDVEN, PROCALCITON, O2SATVEN in the last 168 hours.  ABG  Recent Labs Lab 04/18/2016 2322  PHART 7.398  PCO2ART 35.1  PO2ART 107.0*    Liver Enzymes  Recent Labs Lab 04/08/16 0140  AST 25  ALT 14  ALKPHOS 41  BILITOT 1.1  ALBUMIN 3.2*    Cardiac Enzymes No results for input(s): TROPONINI, PROBNP in the last 168 hours.  Glucose  Recent Labs Lab 04/09/16 1532 04/09/16 1908 04/09/16 2336 11-13-2015 0337 11-13-2015  0721 04/15/2016 1125  GLUCAP 128* 133* 115* 107* 127* 108*    Imaging Ct Head Wo Contrast  Result Date: 04/24/2016 CLINICAL DATA:  Follow-up exam for acute intracranial infarct. EXAM: CT HEAD WITHOUT CONTRAST TECHNIQUE: Contiguous axial images were obtained from the base of the skull through the vertex without intravenous contrast. COMPARISON:  Prior MRI from 04/08/2016 as well as earlier studies. FINDINGS: Large area of evolving cytotoxic edema seen throughout much of the left MCA territory, consistent with evolving ischemic infarct. Overall distribution is stable relative to recent MRI. No evidence for hemorrhagic transformation. Previously noted tiny right cerebral infarcts not seen. Hyperdensity within the left M1 segment noted, compatible  with recurrent/residual thrombus. Associated mass effect throughout the left cerebral hemisphere with mild attenuation of the left lateral ventricle. Trace 3 mm left-to-right shift. Mild crowding of the left basilar cisterns. No hydrocephalus or evidence for ventricular trapping. No other new acute intracranial process. No hemorrhage. No new infarct. No extra-axial fluid collection. Stable atrophy with chronic microvascular ischemic disease. Scalp soft tissues demonstrate no acute abnormality. All no acute abnormality about the globes and orbits. Scattered opacity and mucosal thickening within the paranasal sinuses, similar. No mastoid effusion. Middle ear cavities are well pneumatized. Calvarium intact. IMPRESSION: 1. Overall stable size and distribution of large evolving left MCA territory infarct. No evidence for hemorrhagic transformation. Localized mass effect with mild 3 mm of left-to-right shift. 2. No other new acute intracranial process. Electronically Signed   By: Rise Mu M.D.   On: 04/26/2016 05:30   Dg Chest Port 1 View  Result Date: 04/15/2016 CLINICAL DATA:  Respiratory failure EXAM: PORTABLE CHEST 1 VIEW COMPARISON:  04/09/2016 FINDINGS: Endotracheal tube remains 2.5 cm above the carina unchanged. Mild bibasilar atelectasis unchanged. NG tube in the stomach unchanged Negative for edema or effusion. IMPRESSION: Endotracheal tube 2.5 cm above the carina unchanged. Bibasilar atelectasis unchanged from the prior study. Electronically Signed   By: Marlan Palau M.D.   On: 04/13/2016 07:15     STUDIES:  CT head 8/9 > L MCA infarct  CULTURES: None.  ANTIBIOTICS: None.  SIGNIFICANT EVENTS: 8/9 - CVA, IV TPA, to IR. To ICU for recovery 8/12 neuro spoke with family. Not quite ready for extubation.  LINES/TUBES: ETT 8/9 >  DISCUSSION: Very independent 82 year old suffered R MCA stroke 8/9. Received IV TPA, then to IR. Out to ICU on vent. More alert, but not following  commands. Left sided purposeful movement noted.( Will reach for ETT)  ASSESSMENT / PLAN:  NEUROLOGIC A:   Acute encephalopathy. L MCA infarct - s/p tPA and partial revascularization / mechanical thrombolysis attempts per IR 8/9. More alert and purposeful with Left hand, but not following commands. P:   Limit sedation as able - PRN fentanyl  RASS goal: 0 to -1. Daily WUA. Stroke workup per neuro. Hold outpatient sertraline.   PULMONARY A: Inability to protect airway in the post-operative setting.  Not Following Commands P:   Full vent support.  Daily SBT - tolerating CPAP 5/5 well 8/12 F/u CXR  One way extubation in future  CARDIOVASCULAR A:  H/o A.fib, HTN, MI, CAD. P:  SBP goal 120-140 per Dr. Corliss Skains. Off cardene - resume if needed for BP control  Continue outpatient atorvastatin. Hold outpatient furosemide, nitro, lisinopril.  RENAL A:   No acute issues. Incontinent, but bladder scans show clearing. P:   BMP in AM.  GASTROINTESTINAL A:   GI prophylaxis. Tolerating Nutrition. ++ BS and passing gas  P:   SUP: famotidine. Begin TF 8/10  HEMATOLOGIC  Recent Labs  04/09/16 0519 05/05/2016 0618  HGB 11.3* 10.9*    A:   TPA administered 1530. VTE prophylaxis. HGB 11.3 ( 8/11) No signs obvious bleeding P: SCD's. CBC  Trend platelets   INFECTIOUS A:   No indication of infection. P:   Monitor clinically. WBC trending down 10.8 ( 8/11)  ENDOCRINE A:   Hypothyroidism - TSH 5.3 P:   Continue outpatient synthroid - change to Per tube /day Monitor CBG's   FAMILY  Updates: 2 daughters updated at bedside 8/12 ny S. Jeremiah Tarpley ACNP and then by Neuro. Not quite ready for one way extubation.  Inter-disciplinary family meet or Palliative Care meeting due by:  8/16.  Brett Canales Czarina Gingras ACNP Adolph Pollack PCCM Pager [phone] till 3 pm If no answer page [phone] 05/05/16, 11:47 AM

## 2016-04-30 NOTE — Progress Notes (Signed)
RT Note: Patient has already been started on comfort medications and is tolerating them well. Ventilator was weaned down per withdrawal of life sustaining support protocol. Family and patients RN is at bedside. Patient is tolerating it well and RT will continue to monitor.

## 2016-04-30 NOTE — Discharge Summary (Signed)
Patient ID: Cala BradfordMargaret Sandeen MRN: [id number] DOB/AGE: 80/16/1920 80 y.o.  Admit date: 04/28/2016 Death date: 04/27/2016 at 1640  Admission Diagnoses: Aphasia and right hemiparesis.  Cause of Death:  Respiratory arrest secondary to increased intracranial pressure from large left middle cerebral artery infarct from atrial fibrillation status post IV treatment and unsuccessful attempt at recannulization with mechanical embolectomy. Patient made DO NOT RESUSCITATE and comfort care by family and ventilatory support was withdrawn.  Pertinent Medical Diagnosis:   Active Problems:   Cerebrovascular accident (CVA) due to thrombosis of left carotid artery (HCC)   Other specified hypothyroidism   Acute encephalopathy   DNAR (do not attempt resuscitation)   Terminal care   Respiratory failure (HCC)  Cytotoxic cerebral edema Brain herniation Atrial fibrillation  Hospital Course:    Cala BradfordMargaret Lunsford was an 82 y.o. female who presents as a transfer from [place]. It is reported that at 2:30 this afternoon she fell and was experiencing a facial and right hemiparesis. She arrived in the emergency room within the time window for TPA. The telemetry neurologist recommended administration of TPA. After initiating tPA therapy the ER physician requested transfer of the [facility] room for further evaluation and treatment. Upon arrival the patient was taken to CTA for further evaluation. 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 This was discussed with the family. Gillie's baseline status was discussed in length. The family reports that Claris CheMargaret lived independently and was able to take care of herself in all aspects. He report she was able to prepare her own meals bathing herself clothing herself and otherwise was functionally independent. We discussed the fact that she had been previously seen by neurologist and was noted to have some issues with her cognitive function.  It was also noted that at some point within the past year she was started on Namenda. The family reports that they felt her symptoms were mild and that the Namenda provided no benefit. It was discontinued by the family. The family reports that Claris CheMargaret does not drive or maintain her own finances. However they report she was not actually having any difficulty driving there was simply concerned about her vision and her age. They assisted with her finances as they felt they were somewhat complex. However they report that she would still write her own checks. The possibility of interventional radiology was discussed with the family at length. All 3 children felt that they would like for us to proceed with interventional therapy. The case was discussed with Dr. Shan Levanseveshar. Dr. Shan Levanseveshar came to the hospital and discussed the risk and benefits of interventional neuroradiology with the family. After thorough discussion of the risk and benefits of the procedure the family requested additional intervention. The patient was subsequently taken to the interventional radiology suite but despite multiple attempts only minimum recannulization of the achieved. Patient was admitted to the neurological intensive care unit and follow-up imaging studies showed large left middle cerebral artery infarct with cytotoxic edema and left-to-right brain herniation. The patient's family understood that the patient's prognosis was poor and she would likely need prolonged ventilatory support and tracheostomy and PEG tube and nursing home care. They were clear that patient would not have wanted this hence the mid the patient DO NOT RESUSCITATE and comfort care. Ventilatory support was withdrawn and patient kept comfortable and passed away shortly after this. Patient was found to be pulseless and not breathing and pronounced dead by the RN at 1640 hrs. on 04/03/2016. Time spent in  death summary 15 minutes    Signed: Adaisha Campise 04/12/2016,  9:34 AM

## 2016-04-30 NOTE — Progress Notes (Signed)
Patient's family expressed to RN that they wish to pursue comfort measures per the patient's prior stated desires. Neuro Md notified that family wishes to discuss this matter and proceed along with comfort once all family has arrived.

## 2016-04-30 NOTE — Progress Notes (Signed)
Patient's family has all assembled at the bedside and is ready to proceed with extubation and comfort measures. All questions have been answered. Rn will start fentanyl gtt and prepare for extubation.

## 2016-04-30 NOTE — Progress Notes (Signed)
Patient passed comfortably at 1640 with family and RN at bedside. Pronounced at 1640 by Docia BarrierJosh Miamor Ayler, RN and Kurtis BushmanAmber Howard, RN. E-link, Neuro Md and CDS notified. 175 ml of fentanyl wasted in sink. Witnessed by Docia BarrierJosh Kissy Cielo, RN and Kurtis BushmanAmber Howard, RN. Belongings returned to Son/POA. Family expressed appreciativeness to Md's and staff.

## 2016-04-30 NOTE — Progress Notes (Signed)
   05-05-16 1514  Clinical Encounter Type  Visited With Patient and family together;Health care provider  Visit Type Initial;Spiritual support;Patient actively dying;Critical Care  Referral From Nurse   Chaplain responded to a consult for end of life. Patient will be going through terminal extubation soon. Chaplain met with patient's family. Family's pastor is present, and family expressed no needs at this time. Chaplain introduced spiritual care services. Spiritual care services available as needed.   Jeri Lager, Chaplain 05/05/16 3:15 PM

## 2016-04-30 DEATH — deceased

## 2017-08-29 IMAGING — CT CT HEAD W/O CM
4 series · 16 of 47 positions shown, 18 images · non-contrast
Comparison: Prior MRI from 04/08/2016 as well as earlier studies.

CLINICAL DATA: Follow-up exam for acute intracranial infarct.

EXAM:
CT HEAD WITHOUT CONTRAST
TECHNIQUE: Contiguous axial images were obtained from the base of the skull
through the vertex without intravenous contrast.

[Series 2: head without · axial · non-contrast · 0.44mm/px · z∈[-161,-41]mm · 7 of 33 slices shown, 9 images]
[im 5/33  brain]
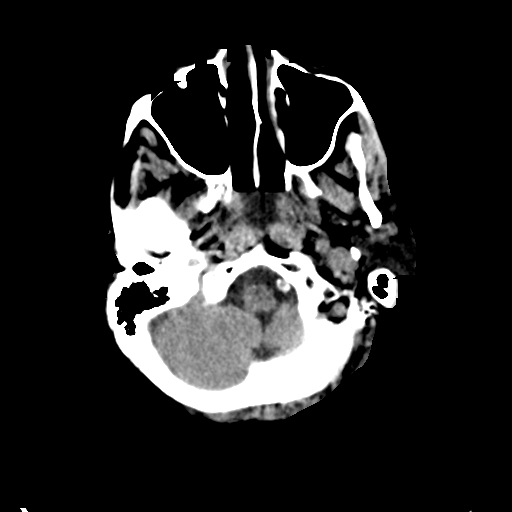
[im 5/33  bone]
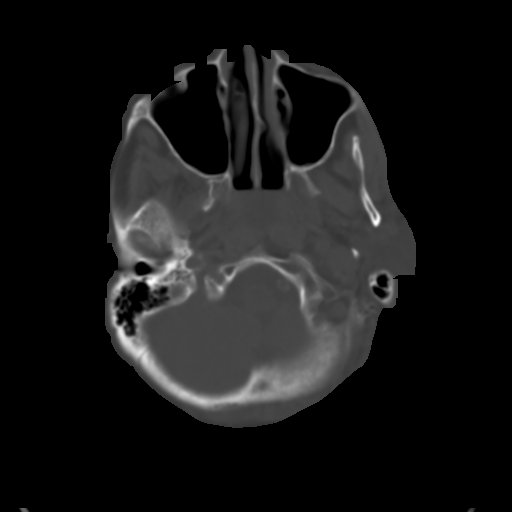
[im 9/33  brain]
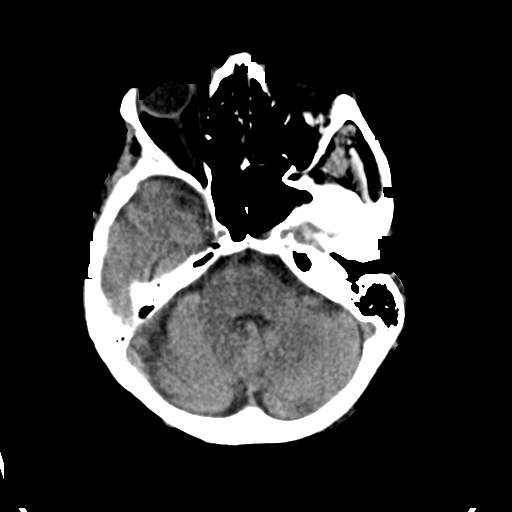
[im 13/33  brain]
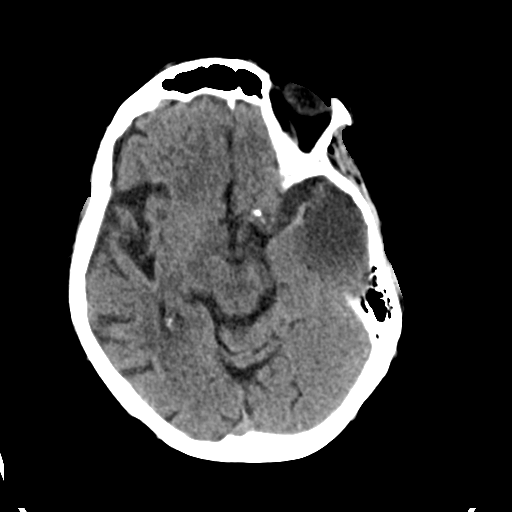
[im 17/33  brain]
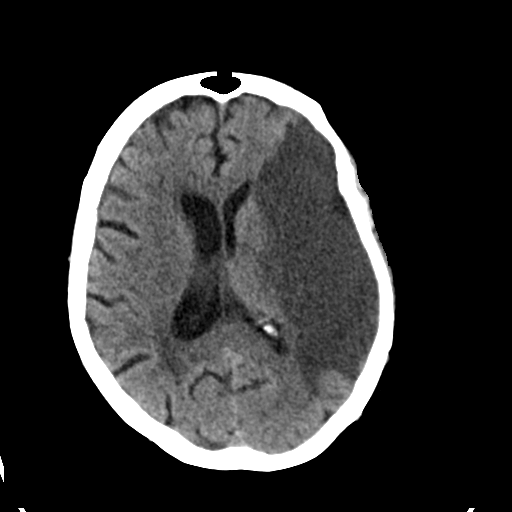
[im 21/33  brain]
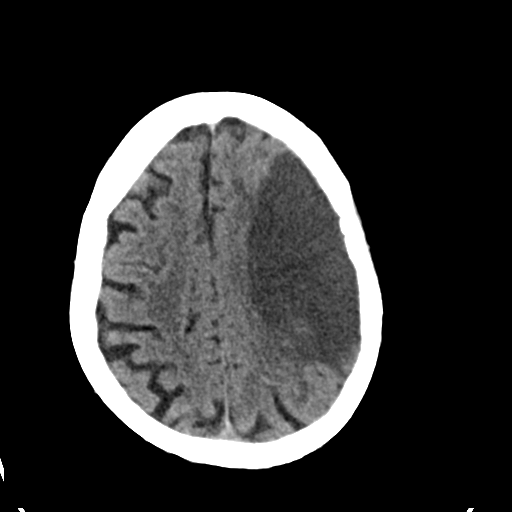
[im 21/33  bone]
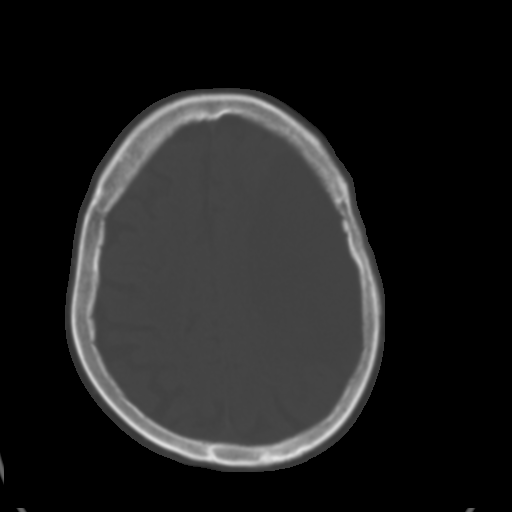
[im 25/33  brain]
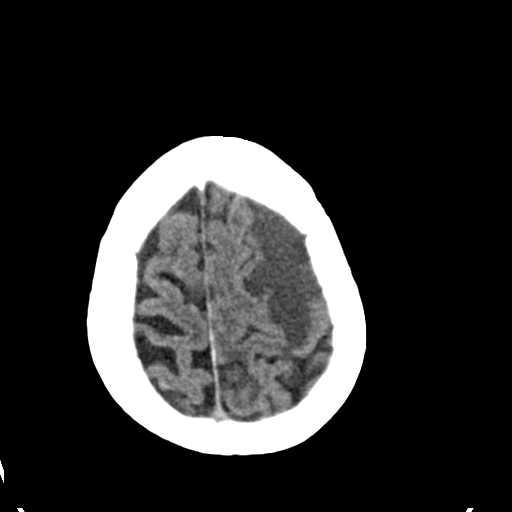
[im 29/33  brain]
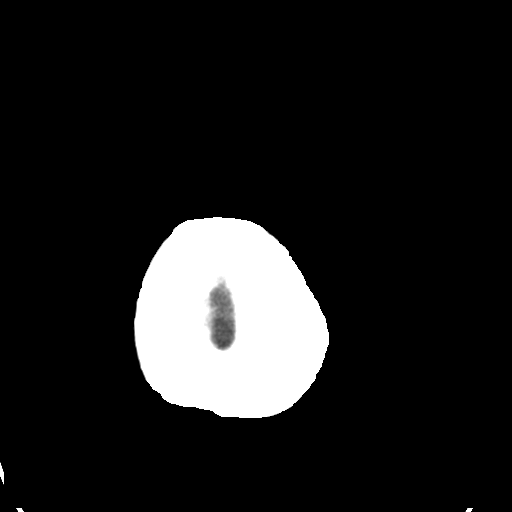

[Series 3: head bone · axial · 0.44mm/px · z∈[-165,-133]mm · 3 of 82 slices shown]
[im 9/82  bone]
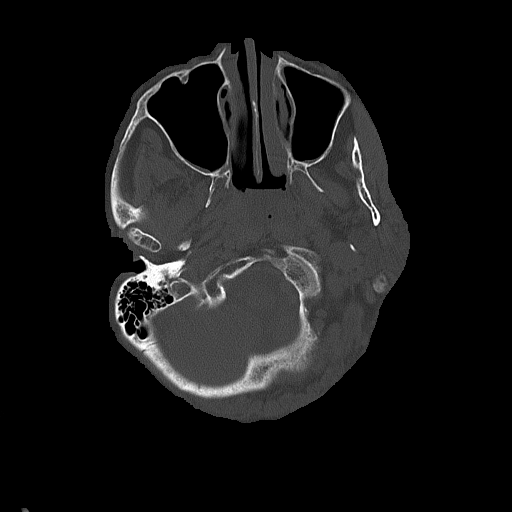
[im 17/82  bone]
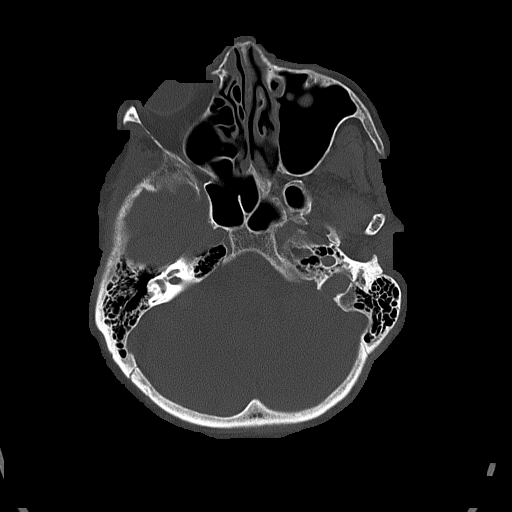
[im 25/82  bone]
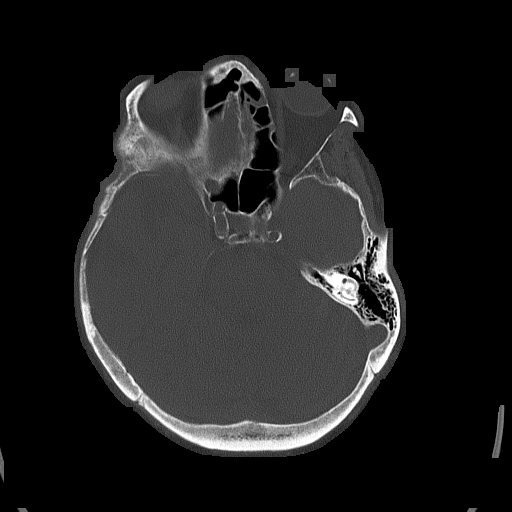

[Series 4: head without cor · coronal · non-contrast · 0.31mm/px · 3 of 68 slices shown]
[im 24/68  brain]
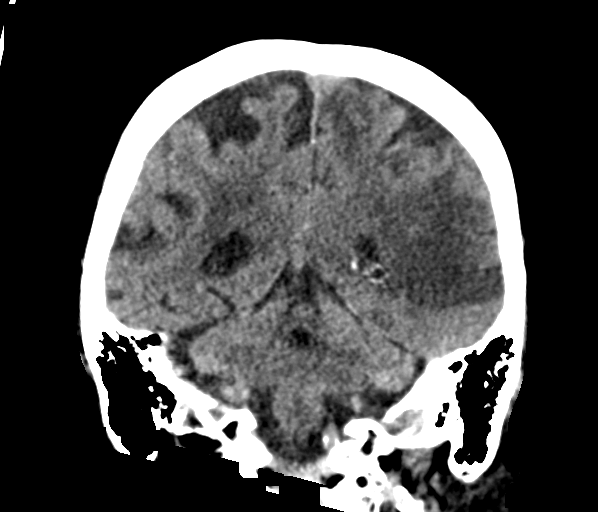
[im 31/68  brain]
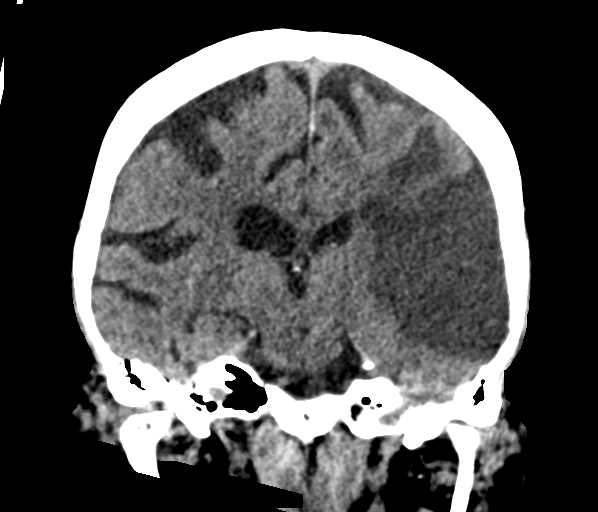
[im 38/68  brain]
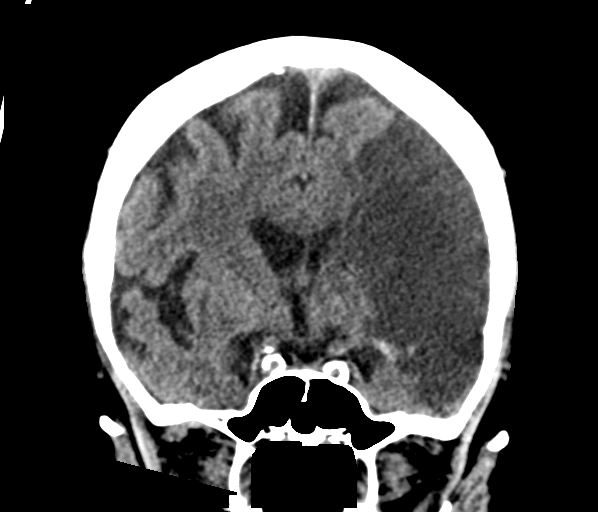

[Series 5: head without sag · sagittal · non-contrast · 0.31mm/px · 3 of 60 slices shown]
[im 27/60  brain]
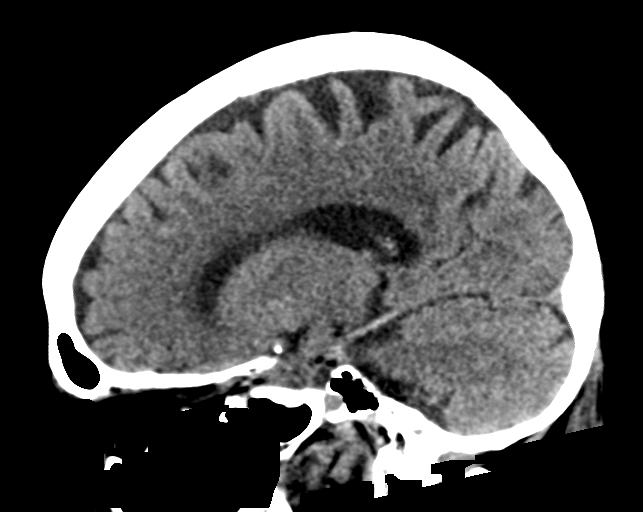
[im 32/60  brain]
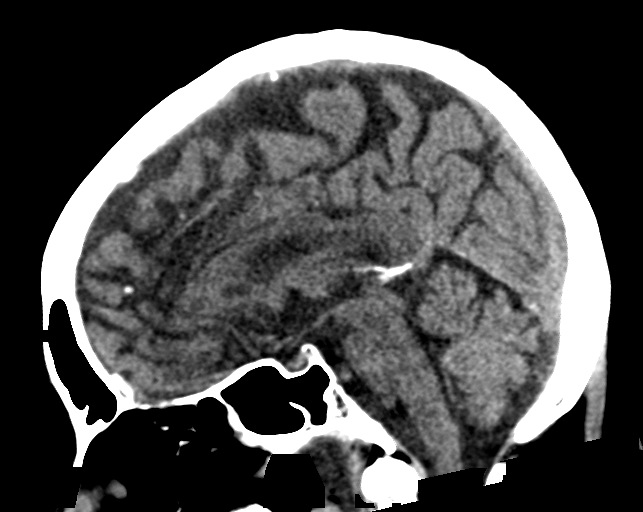
[im 36/60  brain]
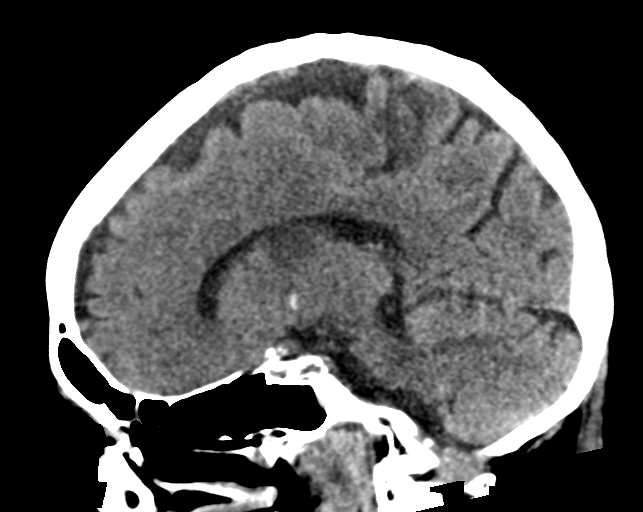

[16 of 47 positions shown; findings below may reference images not displayed]

FINDINGS: Large area of evolving cytotoxic edema seen throughout much of the
left MCA territory, consistent with evolving ischemic infarct.
Overall distribution is stable relative to recent MRI. No evidence
for hemorrhagic transformation. Previously noted tiny right cerebral
infarcts not seen. Hyperdensity within the left M1 segment noted,
compatible with recurrent/residual thrombus. Associated mass effect
throughout the left cerebral hemisphere with mild attenuation of the
left lateral ventricle. Trace 3 mm left-to-right shift. Mild
crowding of the left basilar cisterns. No hydrocephalus or evidence
for ventricular trapping.

No other new acute intracranial process. No hemorrhage. No new
infarct. No extra-axial fluid collection. Stable atrophy with
chronic microvascular ischemic disease.

Scalp soft tissues demonstrate no acute abnormality. All no acute
abnormality about the globes and orbits.

Scattered opacity and mucosal thickening within the paranasal
sinuses, similar. No mastoid effusion. Middle ear cavities are well
pneumatized.

Calvarium intact.
IMPRESSION: 1. Overall stable size and distribution of large evolving left MCA
territory infarct. No evidence for hemorrhagic transformation.
Localized mass effect with mild 3 mm of left-to-right shift.
2. No other new acute intracranial process.

## 2017-08-29 IMAGING — CR DG CHEST 1V PORT
1 series · 1 of 1 positions shown · non-contrast
Comparison: 04/09/2016

CLINICAL DATA: Respiratory failure

EXAM:
PORTABLE CHEST 1 VIEW

[AP]
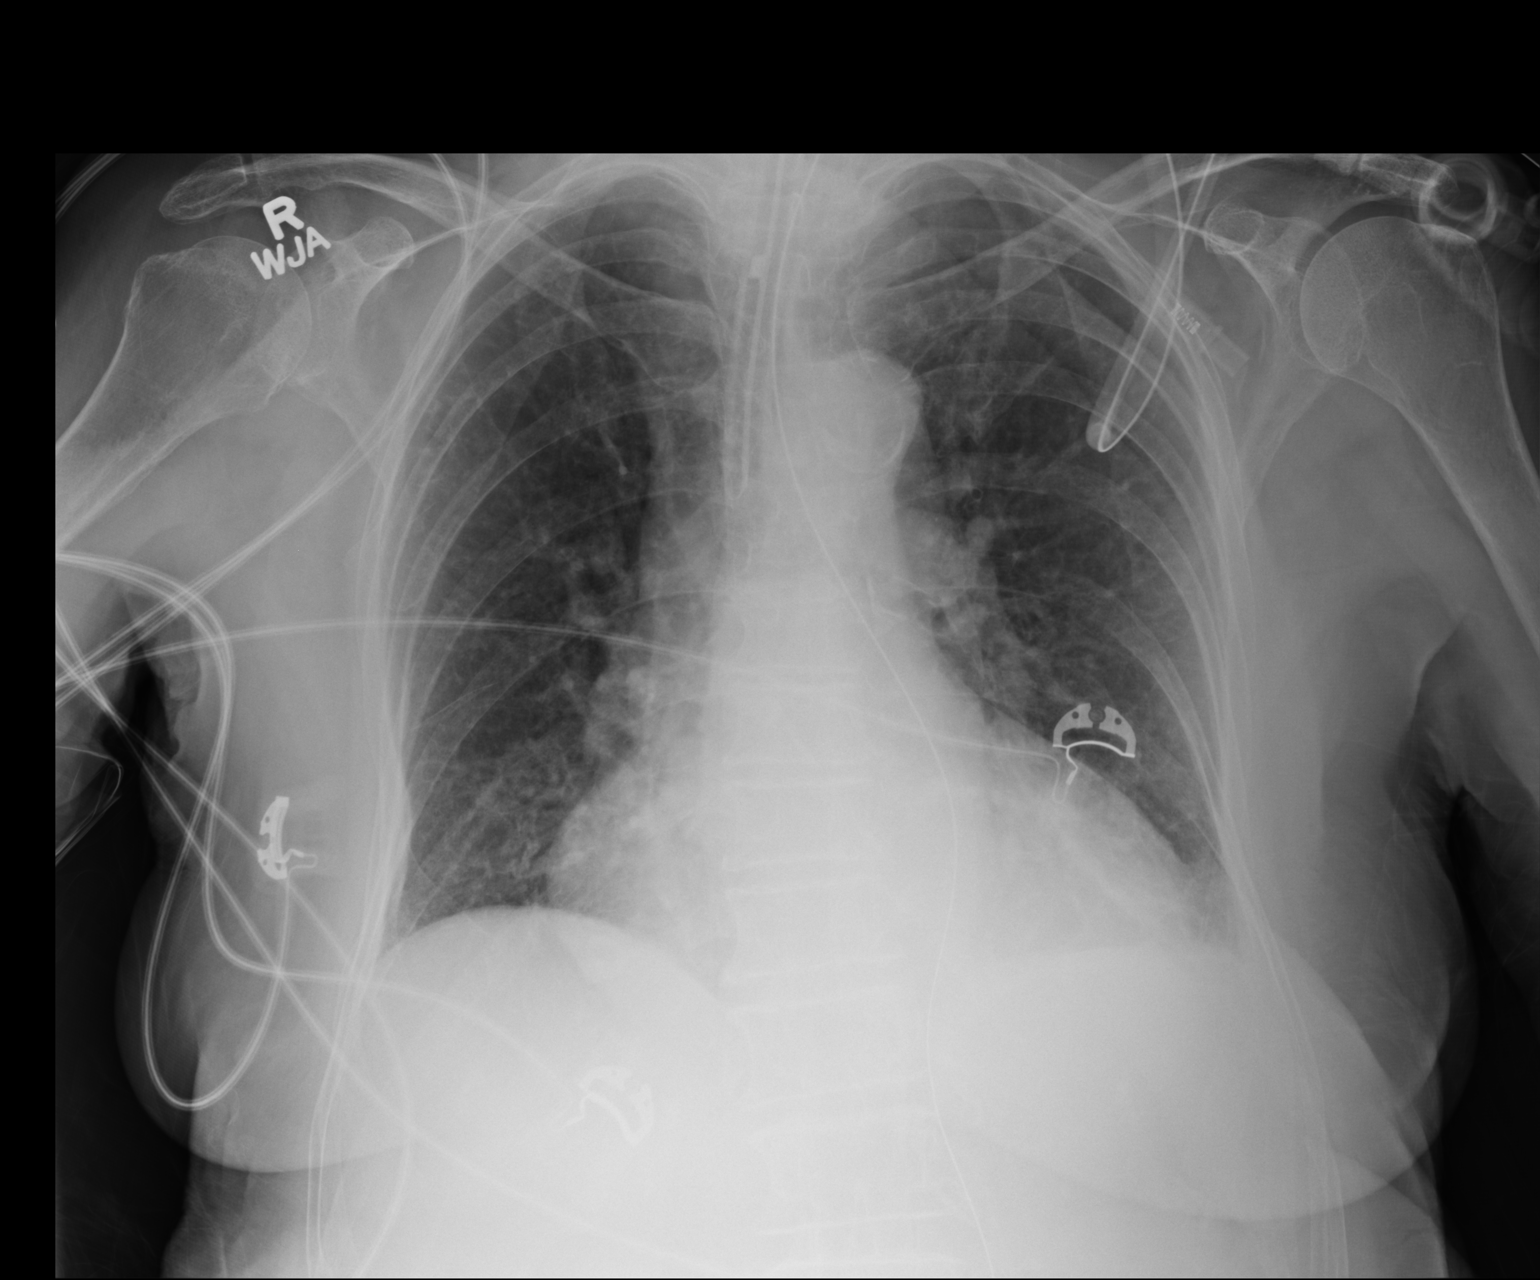

[1 of 1 positions shown; findings below may reference images not displayed]

FINDINGS: Endotracheal tube remains 2.5 cm above the carina unchanged. Mild
bibasilar atelectasis unchanged. NG tube in the stomach unchanged

Negative for edema or effusion.
IMPRESSION: Endotracheal tube 2.5 cm above the carina unchanged. Bibasilar
atelectasis unchanged from the prior study.
